# Patient Record
Sex: Female | Born: 1980 | Race: Black or African American | Hispanic: No | Marital: Single | Smoking: Never smoker
Health system: Southern US, Community
[De-identification: ages and names within clinical notes are randomized; demographics above are authoritative.]

## PROBLEM LIST (undated history)

## (undated) ENCOUNTER — Emergency Department (HOSPITAL_COMMUNITY)

## (undated) ENCOUNTER — Ambulatory Visit (HOSPITAL_COMMUNITY): Admission: EM | Payer: Self-pay | Source: Home / Self Care

## (undated) DIAGNOSIS — R87619 Unspecified abnormal cytological findings in specimens from cervix uteri: Secondary | ICD-10-CM

## (undated) DIAGNOSIS — IMO0002 Reserved for concepts with insufficient information to code with codable children: Secondary | ICD-10-CM

## (undated) DIAGNOSIS — O139 Gestational [pregnancy-induced] hypertension without significant proteinuria, unspecified trimester: Secondary | ICD-10-CM

## (undated) HISTORY — PX: TONSILLECTOMY: SUR1361

---

## 2005-04-23 ENCOUNTER — Emergency Department (HOSPITAL_COMMUNITY): Admission: EM | Admit: 2005-04-23 | Discharge: 2005-04-23 | Payer: Self-pay | Admitting: Emergency Medicine

## 2006-05-09 ENCOUNTER — Emergency Department (HOSPITAL_COMMUNITY): Admission: EM | Admit: 2006-05-09 | Discharge: 2006-05-09 | Payer: Self-pay | Admitting: *Deleted

## 2007-10-13 ENCOUNTER — Inpatient Hospital Stay (HOSPITAL_COMMUNITY): Admission: RE | Admit: 2007-10-13 | Discharge: 2007-10-13 | Payer: Self-pay | Admitting: Obstetrics & Gynecology

## 2007-10-15 ENCOUNTER — Inpatient Hospital Stay (HOSPITAL_COMMUNITY): Admission: AD | Admit: 2007-10-15 | Discharge: 2007-10-15 | Payer: Self-pay | Admitting: Obstetrics & Gynecology

## 2007-10-19 ENCOUNTER — Inpatient Hospital Stay (HOSPITAL_COMMUNITY): Admission: AD | Admit: 2007-10-19 | Discharge: 2007-10-20 | Payer: Self-pay | Admitting: Obstetrics & Gynecology

## 2008-01-27 ENCOUNTER — Inpatient Hospital Stay (HOSPITAL_COMMUNITY): Admission: AD | Admit: 2008-01-27 | Discharge: 2008-01-27 | Payer: Self-pay | Admitting: Family Medicine

## 2008-03-20 ENCOUNTER — Ambulatory Visit (HOSPITAL_COMMUNITY): Admission: RE | Admit: 2008-03-20 | Discharge: 2008-03-20 | Payer: Self-pay | Admitting: Obstetrics and Gynecology

## 2010-03-03 ENCOUNTER — Encounter: Payer: Self-pay | Admitting: Obstetrics & Gynecology

## 2010-04-14 IMAGING — US US OB DETAIL+14 WK
1 series · 14 of 28 positions shown · non-contrast
Comparison: none

OBSTETRICAL ULTRASOUND:
 This ultrasound was performed in The [HOSPITAL], and the [facility] report will be stored to [REDACTED] PACS.

[Series 1: us ob detail+14 wk · 14 of 88 slices shown]
[im 4/88]
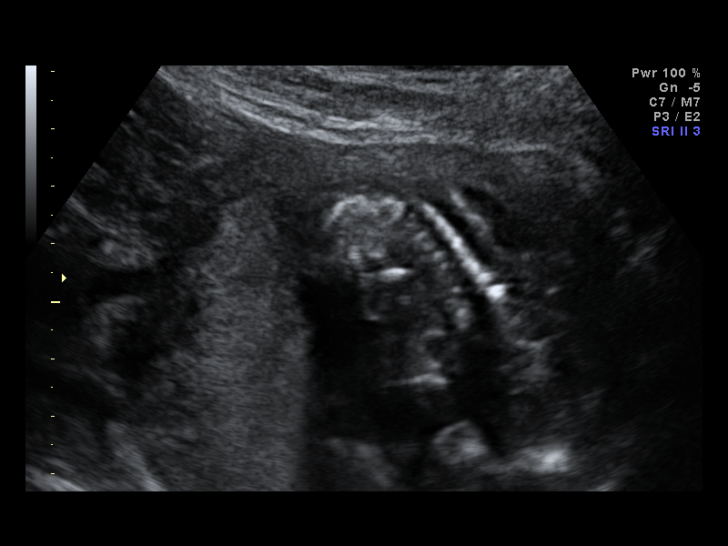
[im 10/88]
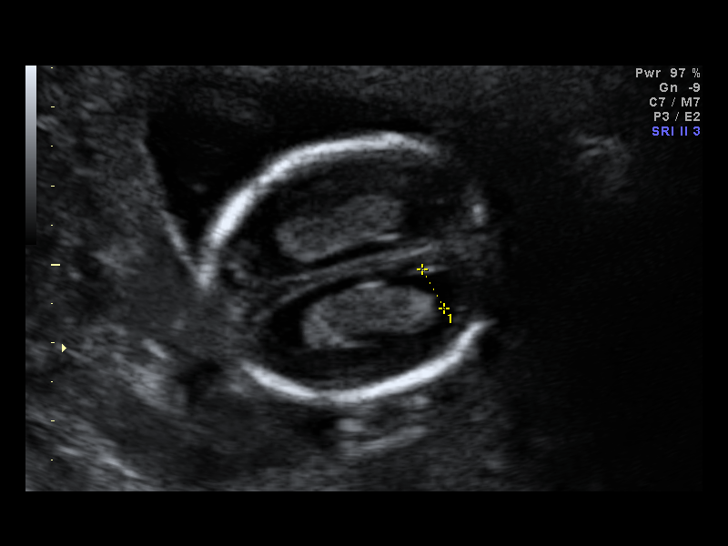
[im 17/88]
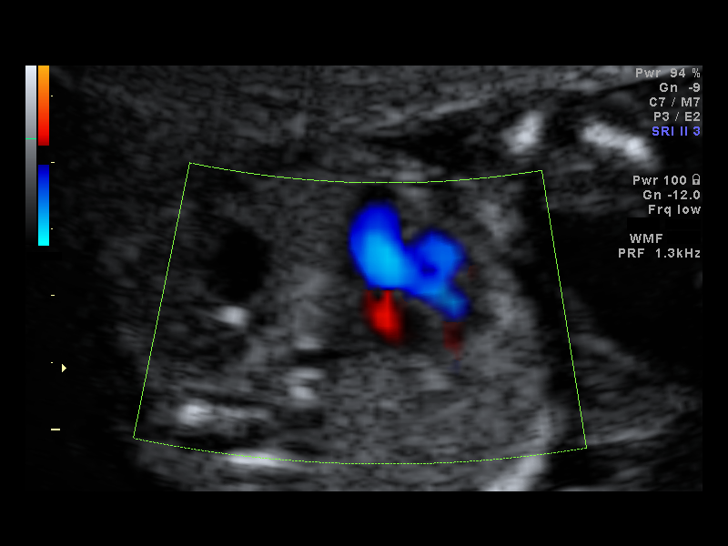
[im 23/88]
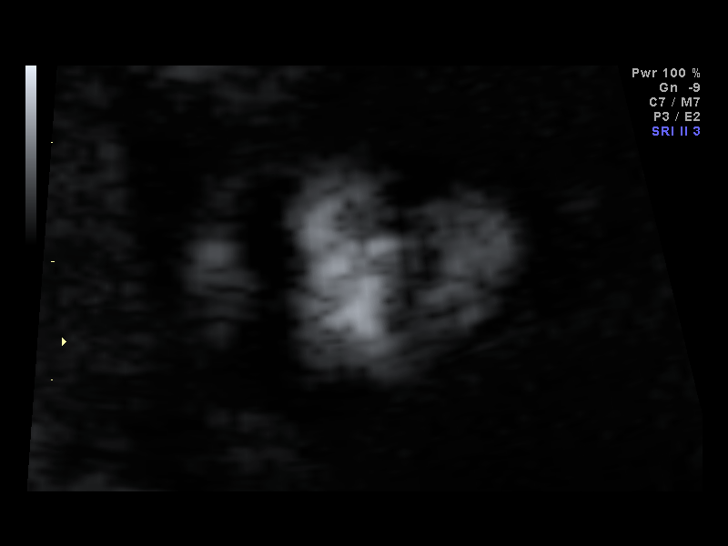
[im 30/88]
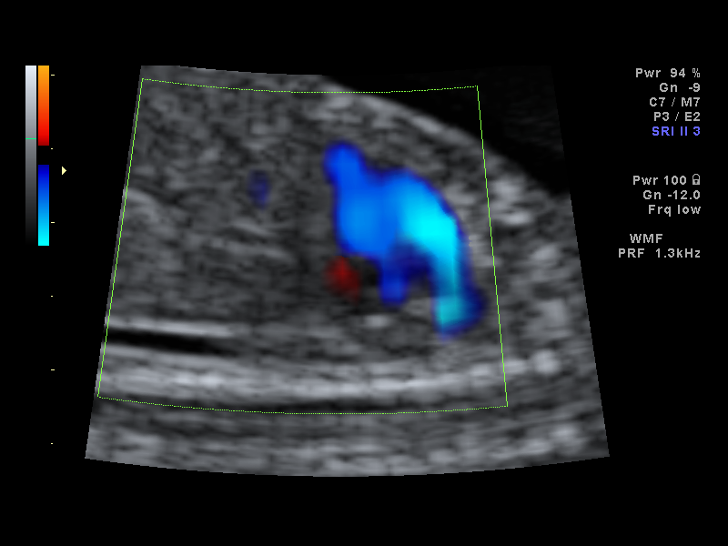
[im 36/88]
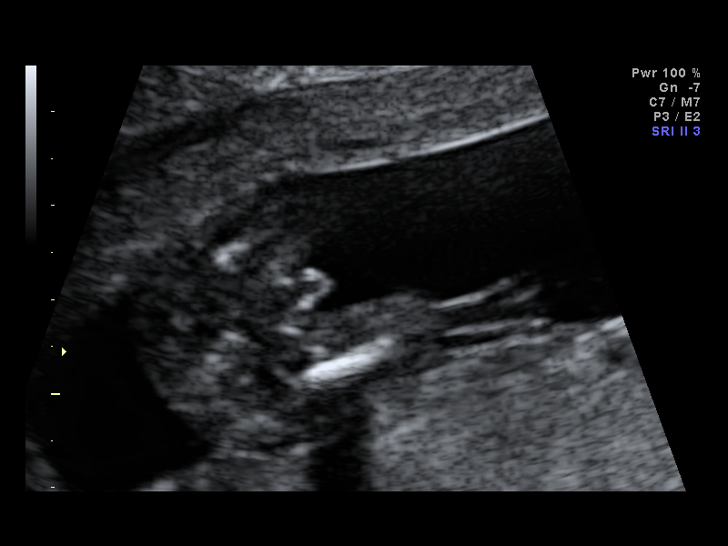
[im 42/88]
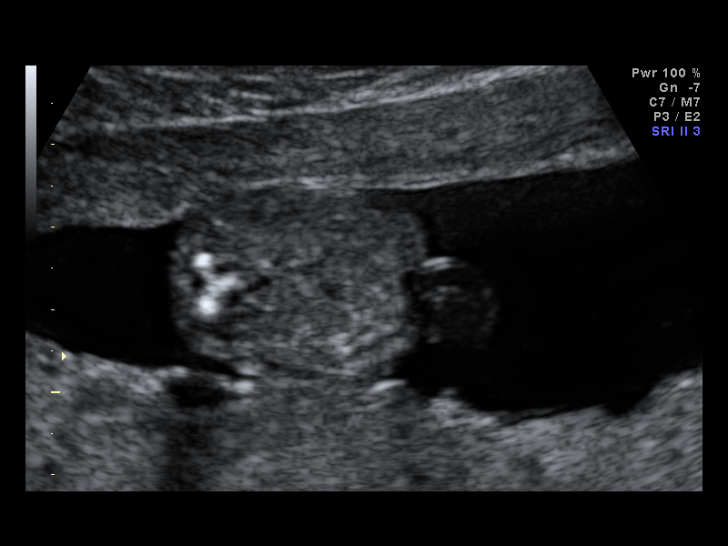
[im 49/88]
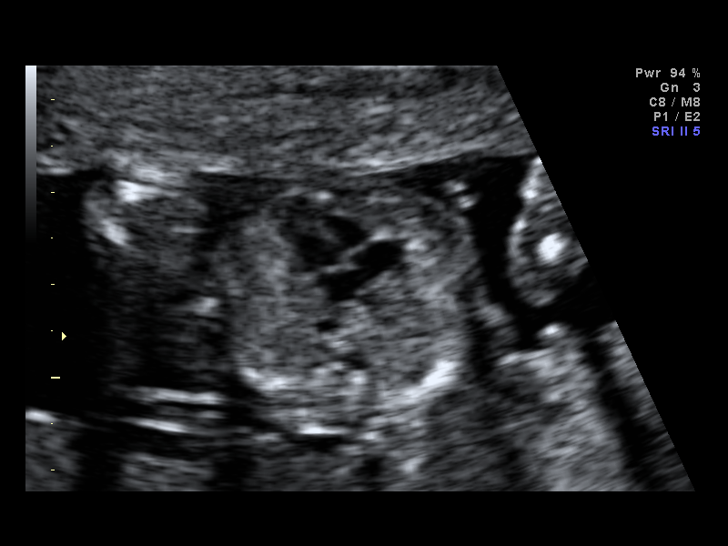
[im 55/88]
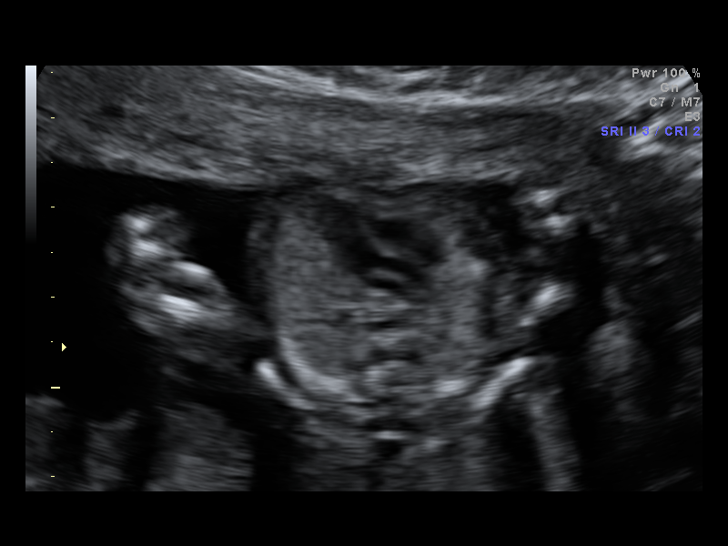
[im 62/88]
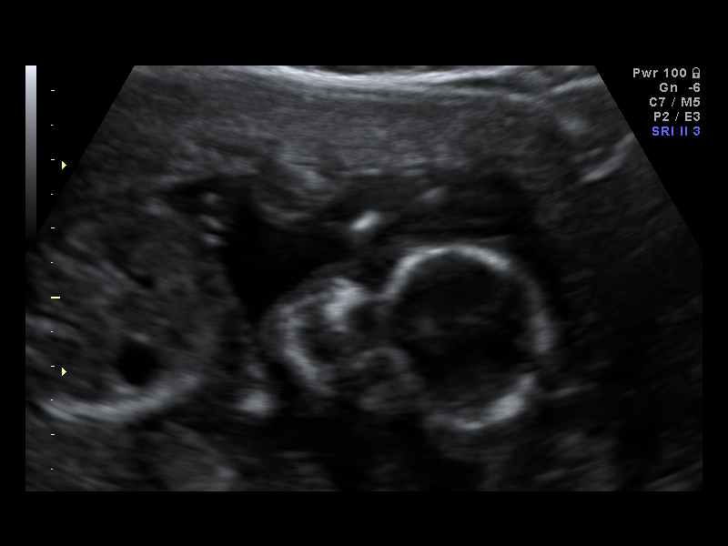
[im 68/88]
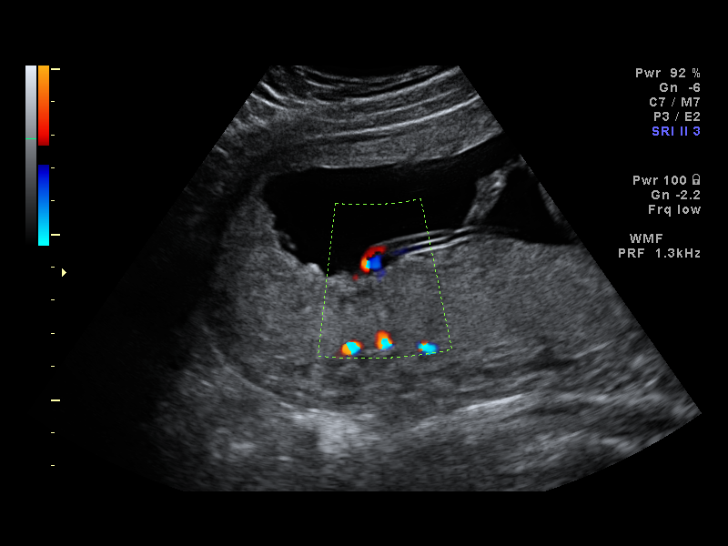
[im 75/88]
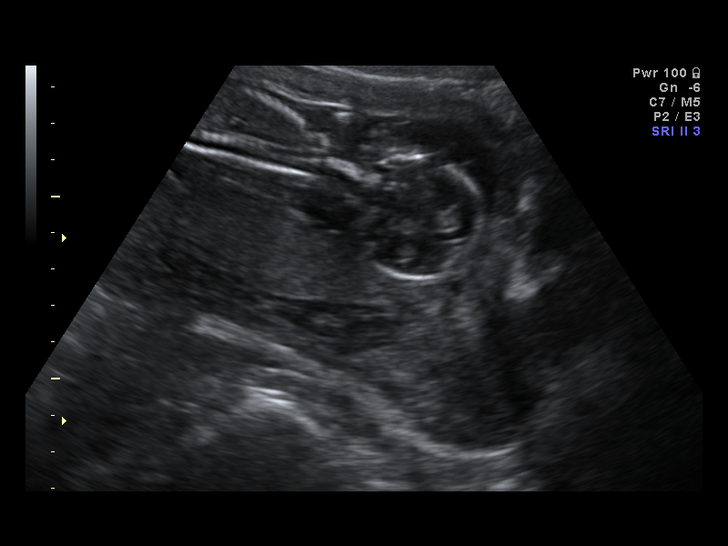
[im 81/88]
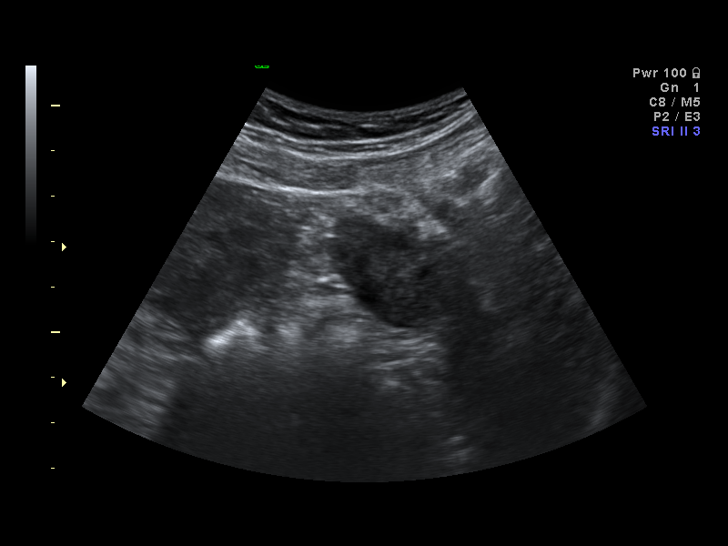
[im 88/88]
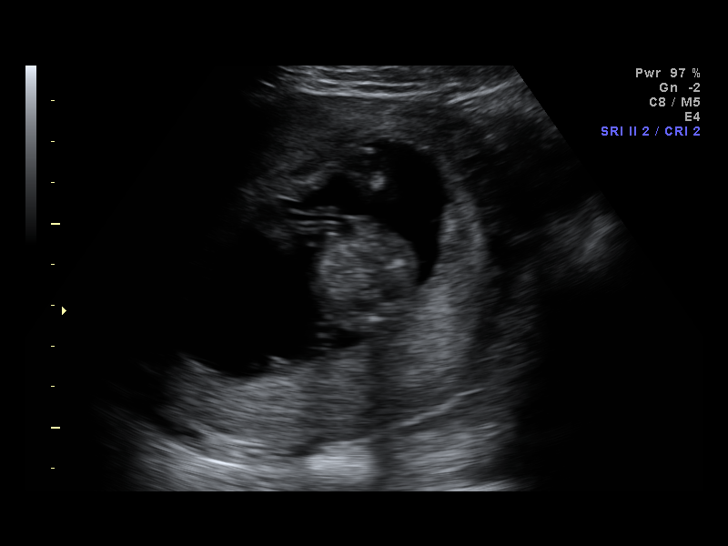

[14 of 28 positions shown; findings below may reference images not displayed]

IMPRESSION: [facility] OB/GYN has also been faxed to the ordering physician.

## 2010-05-28 ENCOUNTER — Inpatient Hospital Stay (INDEPENDENT_AMBULATORY_CARE_PROVIDER_SITE_OTHER)
Admission: RE | Admit: 2010-05-28 | Discharge: 2010-05-28 | Disposition: A | Discharge status: Home / Self Care | Payer: Self-pay | Source: Ambulatory Visit | Attending: Family Medicine | Admitting: Family Medicine

## 2010-05-28 ENCOUNTER — Encounter: Payer: Self-pay | Admitting: Family Medicine

## 2010-05-28 DIAGNOSIS — J029 Acute pharyngitis, unspecified: Secondary | ICD-10-CM

## 2010-05-28 LAB — CONVERTED CEMR LAB: Rapid Strep: NEGATIVE

## 2010-05-31 ENCOUNTER — Telehealth (INDEPENDENT_AMBULATORY_CARE_PROVIDER_SITE_OTHER): Payer: Self-pay

## 2010-07-04 LAB — RUBELLA ANTIBODY, IGM: Rubella: IMMUNE

## 2010-07-04 LAB — ANTIBODY SCREEN: Antibody Screen: NEGATIVE

## 2010-07-04 LAB — ABO/RH: RH Type: POSITIVE

## 2010-07-04 LAB — HIV ANTIBODY (ROUTINE TESTING W REFLEX): HIV: NONREACTIVE

## 2010-07-04 LAB — STREP B DNA PROBE: GBS: POSITIVE

## 2010-07-04 LAB — RPR: RPR: NONREACTIVE

## 2010-10-30 ENCOUNTER — Ambulatory Visit
Admission: RE | Admit: 2010-10-30 | Discharge: 2010-10-30 | Disposition: A | Discharge status: Home / Self Care | Payer: No Typology Code available for payment source | Source: Ambulatory Visit | Attending: Obstetrics and Gynecology | Admitting: Obstetrics and Gynecology

## 2010-10-30 ENCOUNTER — Other Ambulatory Visit: Payer: Self-pay | Admitting: Obstetrics and Gynecology

## 2010-10-30 DIAGNOSIS — R05 Cough: Secondary | ICD-10-CM

## 2010-11-12 LAB — URINALYSIS, ROUTINE W REFLEX MICROSCOPIC
Hgb urine dipstick: NEGATIVE
Nitrite: NEGATIVE
Specific Gravity, Urine: 1.02
Urobilinogen, UA: 0.2

## 2010-11-12 LAB — ABO/RH: ABO/RH(D): B POS

## 2010-11-12 LAB — GC/CHLAMYDIA PROBE AMP, GENITAL
Chlamydia, DNA Probe: NEGATIVE
GC Probe Amp, Genital: NEGATIVE

## 2010-11-12 LAB — CBC
HCT: 34.7 — ABNORMAL LOW
Platelets: 278
RDW: 13.4
WBC: 6.9

## 2010-11-12 LAB — WET PREP, GENITAL: Yeast Wet Prep HPF POC: NONE SEEN

## 2010-11-12 LAB — HCG, QUANTITATIVE, PREGNANCY: hCG, Beta Chain, Quant, S: 1839 — ABNORMAL HIGH

## 2010-11-14 LAB — CBC
HCT: 34.3 % — ABNORMAL LOW (ref 36.0–46.0)
MCV: 85.7 fL (ref 78.0–100.0)
Platelets: 216 10*3/uL (ref 150–400)
RBC: 4.01 MIL/uL (ref 3.87–5.11)
WBC: 9.9 10*3/uL (ref 4.0–10.5)

## 2010-11-14 LAB — URINALYSIS, ROUTINE W REFLEX MICROSCOPIC
Glucose, UA: NEGATIVE mg/dL
Hgb urine dipstick: NEGATIVE
Specific Gravity, Urine: 1.02 (ref 1.005–1.030)
pH: 6.5 (ref 5.0–8.0)

## 2010-11-14 LAB — WET PREP, GENITAL
Trich, Wet Prep: NONE SEEN
Yeast Wet Prep HPF POC: NONE SEEN

## 2010-11-14 LAB — ABO/RH: ABO/RH(D): B POS

## 2010-11-14 LAB — GC/CHLAMYDIA PROBE AMP, GENITAL: GC Probe Amp, Genital: NEGATIVE

## 2010-12-08 ENCOUNTER — Encounter (HOSPITAL_COMMUNITY): Payer: Self-pay | Admitting: Obstetrics

## 2010-12-08 ENCOUNTER — Observation Stay (HOSPITAL_COMMUNITY)
Admission: AD | Admit: 2010-12-08 | Discharge: 2010-12-08 | Disposition: A | Discharge status: Home / Self Care | Payer: No Typology Code available for payment source | Source: Ambulatory Visit | Attending: Obstetrics and Gynecology | Admitting: Obstetrics and Gynecology

## 2010-12-08 DIAGNOSIS — O99891 Other specified diseases and conditions complicating pregnancy: Secondary | ICD-10-CM | POA: Insufficient documentation

## 2010-12-08 DIAGNOSIS — Z98891 History of uterine scar from previous surgery: Secondary | ICD-10-CM

## 2010-12-08 DIAGNOSIS — R51 Headache: Secondary | ICD-10-CM

## 2010-12-08 DIAGNOSIS — O139 Gestational [pregnancy-induced] hypertension without significant proteinuria, unspecified trimester: Principal | ICD-10-CM

## 2010-12-08 DIAGNOSIS — O34219 Maternal care for unspecified type scar from previous cesarean delivery: Secondary | ICD-10-CM | POA: Insufficient documentation

## 2010-12-08 DIAGNOSIS — Z22330 Carrier of Group B streptococcus: Secondary | ICD-10-CM | POA: Insufficient documentation

## 2010-12-08 DIAGNOSIS — R519 Headache, unspecified: Secondary | ICD-10-CM

## 2010-12-08 HISTORY — DX: Unspecified abnormal cytological findings in specimens from cervix uteri: R87.619

## 2010-12-08 HISTORY — DX: Reserved for concepts with insufficient information to code with codable children: IMO0002

## 2010-12-08 HISTORY — DX: Gestational (pregnancy-induced) hypertension without significant proteinuria, unspecified trimester: O13.9

## 2010-12-08 LAB — CBC
Hemoglobin: 11.2 g/dL — ABNORMAL LOW (ref 12.0–15.0)
RBC: 3.9 MIL/uL (ref 3.87–5.11)
WBC: 11 10*3/uL — ABNORMAL HIGH (ref 4.0–10.5)

## 2010-12-08 LAB — URINALYSIS, ROUTINE W REFLEX MICROSCOPIC
Leukocytes, UA: NEGATIVE
Nitrite: NEGATIVE
Specific Gravity, Urine: 1.02 (ref 1.005–1.030)
pH: 7 (ref 5.0–8.0)

## 2010-12-08 LAB — COMPREHENSIVE METABOLIC PANEL
ALT: 9 U/L (ref 0–35)
AST: 13 U/L (ref 0–37)
Alkaline Phosphatase: 108 U/L (ref 39–117)
CO2: 24 mEq/L (ref 19–32)
GFR calc Af Amer: >90 mL/min (ref >90)
GFR calc non Af Amer: >90 mL/min (ref >90)
Glucose, Bld: 105 mg/dL — ABNORMAL HIGH (ref 70–99)
Potassium: 3.9 mEq/L (ref 3.5–5.1)
Sodium: 135 mEq/L (ref 135–145)

## 2010-12-08 MED ORDER — ACETAMINOPHEN-CODEINE #3 300-30 MG PO TABS: 1.0000 | ORAL_TABLET | ORAL | Status: DC | PRN

## 2010-12-08 MED ORDER — ACETAMINOPHEN-CODEINE #3 300-30 MG PO TABS
1.0000 | ORAL_TABLET | ORAL | Status: DC | PRN
  Administered 2010-12-08: 1 via ORAL
  Filled 2010-12-08: qty 2

## 2010-12-08 NOTE — Progress Notes (Signed)
S:  Candice Huffman is a 30y.o. Engaged black female G3P1011 at 39 weeks per [facility] 12/15/10 who presents w/ CC of frontal HA since around 2am, and vision being unfocused this afternoon making her feel "faint."  Pt has taken 2gm of Tylenol today, and HA unrelieved; rates HA 2/10.  She denies any other PIH s/s.  Denies UTI s/s, resp or GI c/o's; no LOF or VB.  Reports occasional ctx, and good fetal movement.  Cx 1cm at last week's appt.  Followed by CNM service at [facility] and desires TOLAC.  Pregnancy r/f 1.  H/o prev c/s--induced for PIH and c/s for fetal tachycardia and intrapartal fever  2.  H/o PIH--no meds before or after delivery  3.  1st trimester VB--SCH on u/s  4.  H/o abnl pap/colpo 5.  GBS pos in urine  6.  Pt is an [employer] fund[employer]  7.  H/o anxiety  O:  Serial BP's:  146/98, 132/82, 132/81, 128/84, 147/85, 139/85, 139/78; .Marland Kitchen Filed Vitals:   12/08/10 1900 12/08/10 1917 12/08/10 1932 12/08/10 1947  BP: 128/84 147/85 139/85 139/78  Pulse: 86 81 89 87  Temp:      TempSrc:      Resp:  18 18   Height: 5\' 4"  (1.626 m)     Weight: 89.812 kg (198 lb)       Results for orders placed during the hospital encounter of 12/08/10 (from the past 24 hour(s))  URINALYSIS, ROUTINE W REFLEX MICROSCOPIC     Status: Normal   Collection Time   12/08/10  6:20 PM      Component Value Range   Color, Urine YELLOW  YELLOW    Appearance CLEAR  CLEAR    Specific Gravity, Urine 1.020  1.005 - 1.030    pH 7.0  5.0 - 8.0    Glucose, UA NEGATIVE  NEGATIVE (mg/dL)   Hgb urine dipstick NEGATIVE  NEGATIVE    Bilirubin Urine NEGATIVE  NEGATIVE    Ketones, ur NEGATIVE  NEGATIVE (mg/dL)   Protein, ur NEGATIVE  NEGATIVE (mg/dL)   Urobilinogen, UA 1.0  0.0 - 1.0 (mg/dL)   Nitrite NEGATIVE  NEGATIVE    Leukocytes, UA NEGATIVE  NEGATIVE   CBC     Status: Abnormal   Collection Time   12/08/10  7:01 PM      Component Value Range   WBC 11.0 (*) 4.0 - 10.5 (K/uL)   RBC 3.90  3.87 - 5.11 (MIL/uL)   Hemoglobin 11.2 (*) 12.0 - 15.0  (g/dL)   HCT 16.1 (*) 09.6 - 46.0 (%)   MCV 87.2  78.0 - 100.0 (fL)   MCH 28.7  26.0 - 34.0 (pg)   MCHC 32.9  30.0 - 36.0 (g/dL)   RDW 04.5  40.9 - 81.1 (%)   Platelets 197  150 - 400 (K/uL)  COMPREHENSIVE METABOLIC PANEL     Status: Abnormal   Collection Time   12/08/10  7:01 PM      Component Value Range   Sodium 135  135 - 145 (mEq/L)   Potassium 3.9  3.5 - 5.1 (mEq/L)   Chloride 104  96 - 112 (mEq/L)   CO2 24  19 - 32 (mEq/L)   Glucose, Bld 105 (*) 70 - 99 (mg/dL)   BUN 7  6 - 23 (mg/dL)   Creatinine, Ser 9.14  0.50 - 1.10 (mg/dL)   Calcium 9.7  8.4 - 78.2 (mg/dL)   Total Protein 6.7  6.0 - 8.3 (g/dL)  Albumin 2.8 (*) 3.5 - 5.2 (g/dL)   AST 13  0 - 37 (U/L)   ALT 9  0 - 35 (U/L)   Alkaline Phosphatase 108  39 - 117 (U/L)   Total Bilirubin 0.3  0.3 - 1.2 (mg/dL)   GFR calc non Af Amer >90  >90 (mL/min)   GFR calc Af Amer >90  >90 (mL/min)  LACTATE DEHYDROGENASE     Status: Normal   Collection Time   12/08/10  7:01 PM      Component Value Range   LD 200  94 - 250 (U/L)  URIC ACID     Status: Normal   Collection Time   12/08/10  7:01 PM      Component Value Range   Uric Acid, Serum 4.1  2.4 - 7.0 (mg/dL)  EFM:  409, reactive, moderate variability, no decels; TOCO:  occ'l ctxs--mild to mod  PE:  Gen:  NAD, A&Ox3, pleasant, glasses;         Lungs:  CTA B         CV:  RRR         ABD:  Gravid, soft, NT         CX:  Loose 1cm/60/-1, posterior, soft         EXT:  Tr edema--nonpitting; no clonus, DTR1+ BLE A:  1.  IUP at 39 weeks       2.  Mild gestational hypertension      3.  Nml PIH labs      4.  Cat I FHT/Reactive      5.  Unfavorable cx      6.  GBS pos      7.  Prev c/s & desires TOLAC      8.  H/o PIH last pregnancy P:  1.  Per c/w Dr. Rivard--d/c home w/ [facility] & labor precautions, [facility]       2.  Level 2 BR      3.  Called in Tylenol #3 Rx to [facility] on [address]     4.  F/u at office tomorrow morning as scheduled at 0845 or prn     5.  Disc'd importance of  increased rest for BP; eluded may be asked to collect a 24 hr urine at some point; begin antenatal fetal testing.

## 2010-12-08 NOTE — Discharge Summary (Signed)
Pt given discharge instruction. Pt verbalized understanding.

## 2010-12-10 NOTE — Progress Notes (Signed)
UR chart review completed.  

## 2010-12-14 ENCOUNTER — Inpatient Hospital Stay (HOSPITAL_COMMUNITY): Payer: No Typology Code available for payment source | Admitting: Anesthesiology

## 2010-12-14 ENCOUNTER — Encounter (HOSPITAL_COMMUNITY): Payer: Self-pay | Admitting: Obstetrics and Gynecology

## 2010-12-14 ENCOUNTER — Inpatient Hospital Stay (HOSPITAL_COMMUNITY)
Admission: AD | Admit: 2010-12-14 | Discharge: 2010-12-17 | DRG: 767 | Disposition: A | Discharge status: Home / Self Care | Payer: No Typology Code available for payment source | Source: Ambulatory Visit | Attending: Obstetrics and Gynecology | Admitting: Obstetrics and Gynecology

## 2010-12-14 ENCOUNTER — Encounter (HOSPITAL_COMMUNITY): Payer: Self-pay | Admitting: Anesthesiology

## 2010-12-14 DIAGNOSIS — O139 Gestational [pregnancy-induced] hypertension without significant proteinuria, unspecified trimester: Principal | ICD-10-CM | POA: Diagnosis present

## 2010-12-14 DIAGNOSIS — IMO0001 Reserved for inherently not codable concepts without codable children: Secondary | ICD-10-CM

## 2010-12-14 DIAGNOSIS — Z302 Encounter for sterilization: Secondary | ICD-10-CM | POA: Diagnosis not present

## 2010-12-14 DIAGNOSIS — Z8659 Personal history of other mental and behavioral disorders: Secondary | ICD-10-CM

## 2010-12-14 DIAGNOSIS — O99892 Other specified diseases and conditions complicating childbirth: Secondary | ICD-10-CM | POA: Diagnosis present

## 2010-12-14 DIAGNOSIS — Z22330 Carrier of Group B streptococcus: Secondary | ICD-10-CM

## 2010-12-14 DIAGNOSIS — O34219 Maternal care for unspecified type scar from previous cesarean delivery: Secondary | ICD-10-CM | POA: Diagnosis present

## 2010-12-14 DIAGNOSIS — O99820 Streptococcus B carrier state complicating pregnancy: Secondary | ICD-10-CM

## 2010-12-14 LAB — CBC
HCT: 33.5 % — ABNORMAL LOW (ref 36.0–46.0)
Hemoglobin: 11.1 g/dL — ABNORMAL LOW (ref 12.0–15.0)
MCH: 28.6 pg (ref 26.0–34.0)
WBC: 13.5 10*3/uL — ABNORMAL HIGH (ref 4.0–10.5)

## 2010-12-14 LAB — COMPREHENSIVE METABOLIC PANEL
ALT: 10 U/L (ref 0–35)
Alkaline Phosphatase: 123 U/L — ABNORMAL HIGH (ref 39–117)
BUN: 8 mg/dL (ref 6–23)
CO2: 22 mEq/L (ref 19–32)
GFR calc Af Amer: >90 mL/min (ref >90)
GFR calc non Af Amer: >90 mL/min (ref >90)
Glucose, Bld: 88 mg/dL (ref 70–99)
Potassium: 3.7 mEq/L (ref 3.5–5.1)
Sodium: 132 mEq/L — ABNORMAL LOW (ref 135–145)

## 2010-12-14 LAB — URINALYSIS, ROUTINE W REFLEX MICROSCOPIC
Bilirubin Urine: NEGATIVE
Ketones, ur: NEGATIVE mg/dL
Nitrite: NEGATIVE
Protein, ur: NEGATIVE mg/dL
Urobilinogen, UA: 0.2 mg/dL (ref 0.0–1.0)

## 2010-12-14 LAB — RPR: RPR Ser Ql: NONREACTIVE

## 2010-12-14 MED ORDER — PHENYLEPHRINE 40 MCG/ML (10ML) SYRINGE FOR IV PUSH (FOR BLOOD PRESSURE SUPPORT)
80.0000 ug | PREFILLED_SYRINGE | INTRAVENOUS | Status: DC | PRN
  Filled 2010-12-14: qty 5

## 2010-12-14 MED ORDER — FLEET ENEMA 7-19 GM/118ML RE ENEM: 1.0000 | ENEMA | RECTAL | Status: DC | PRN

## 2010-12-14 MED ORDER — EPHEDRINE 5 MG/ML INJ: 10.0000 mg | INTRAVENOUS | Status: DC | PRN

## 2010-12-14 MED ORDER — LIDOCAINE HCL (PF) 1 % IJ SOLN
30.0000 mL | INTRAMUSCULAR | Status: DC | PRN
  Filled 2010-12-14: qty 30

## 2010-12-14 MED ORDER — OXYTOCIN 20 UNITS IN LACTATED RINGERS INFUSION - SIMPLE
125.0000 mL/h | Freq: Once | INTRAVENOUS | Status: AC
  Administered 2010-12-15: 999 mL/h via INTRAVENOUS

## 2010-12-14 MED ORDER — OXYCODONE-ACETAMINOPHEN 5-325 MG PO TABS: 2.0000 | ORAL_TABLET | ORAL | Status: DC | PRN

## 2010-12-14 MED ORDER — FENTANYL 2.5 MCG/ML BUPIVACAINE 1/10 % EPIDURAL INFUSION (WH - ANES)
INTRAMUSCULAR | Status: DC | PRN
  Administered 2010-12-14: 14 mL/h via EPIDURAL

## 2010-12-14 MED ORDER — BUTORPHANOL TARTRATE 2 MG/ML IJ SOLN
1.0000 mg | INTRAMUSCULAR | Status: DC | PRN
  Administered 2010-12-14: 1 mg via INTRAVENOUS
  Filled 2010-12-14 (×2): qty 1

## 2010-12-14 MED ORDER — LACTATED RINGERS IV SOLN: 500.0000 mL | Freq: Once | INTRAVENOUS | Status: DC

## 2010-12-14 MED ORDER — ACETAMINOPHEN 325 MG PO TABS: 650.0000 mg | ORAL_TABLET | ORAL | Status: DC | PRN

## 2010-12-14 MED ORDER — OXYTOCIN BOLUS FROM INFUSION
500.0000 mL | Freq: Once | INTRAVENOUS | Status: AC
  Administered 2010-12-14: 500 mL via INTRAVENOUS
  Filled 2010-12-14: qty 500

## 2010-12-14 MED ORDER — IBUPROFEN 600 MG PO TABS
600.0000 mg | ORAL_TABLET | Freq: Four times a day (QID) | ORAL | Status: DC | PRN
  Administered 2010-12-16: 600 mg via ORAL
  Filled 2010-12-14 (×5): qty 1

## 2010-12-14 MED ORDER — CITRIC ACID-SODIUM CITRATE 334-500 MG/5ML PO SOLN: 30.0000 mL | ORAL | Status: DC | PRN

## 2010-12-14 MED ORDER — FENTANYL 2.5 MCG/ML BUPIVACAINE 1/10 % EPIDURAL INFUSION (WH - ANES)
14.0000 mL/h | INTRAMUSCULAR | Status: DC
  Administered 2010-12-14 – 2010-12-15 (×2): 14 mL/h via EPIDURAL
  Filled 2010-12-14 (×3): qty 60

## 2010-12-14 MED ORDER — LACTATED RINGERS IV SOLN
INTRAVENOUS | Status: DC
  Administered 2010-12-14 (×2): via INTRAVENOUS

## 2010-12-14 MED ORDER — DIPHENHYDRAMINE HCL 50 MG/ML IJ SOLN: 12.5000 mg | INTRAMUSCULAR | Status: DC | PRN

## 2010-12-14 MED ORDER — HYDROXYZINE HCL 50 MG/ML IM SOLN: 50.0000 mg | Freq: Four times a day (QID) | INTRAMUSCULAR | Status: DC | PRN

## 2010-12-14 MED ORDER — PENICILLIN G POTASSIUM 5000000 UNITS IJ SOLR
2.5000 10*6.[IU] | INTRAVENOUS | Status: DC
  Administered 2010-12-14 – 2010-12-15 (×2): 2.5 10*6.[IU] via INTRAVENOUS
  Filled 2010-12-14 (×6): qty 2.5

## 2010-12-14 MED ORDER — ONDANSETRON HCL 4 MG/2ML IJ SOLN: 4.0000 mg | Freq: Four times a day (QID) | INTRAMUSCULAR | Status: DC | PRN

## 2010-12-14 MED ORDER — HYDROXYZINE HCL 50 MG PO TABS: 50.0000 mg | ORAL_TABLET | Freq: Four times a day (QID) | ORAL | Status: DC | PRN

## 2010-12-14 MED ORDER — PHENYLEPHRINE 40 MCG/ML (10ML) SYRINGE FOR IV PUSH (FOR BLOOD PRESSURE SUPPORT): 80.0000 ug | PREFILLED_SYRINGE | INTRAVENOUS | Status: DC | PRN

## 2010-12-14 MED ORDER — EPHEDRINE 5 MG/ML INJ
10.0000 mg | INTRAVENOUS | Status: DC | PRN
  Filled 2010-12-14: qty 4

## 2010-12-14 MED ORDER — SODIUM BICARBONATE 8.4 % IV SOLN
INTRAVENOUS | Status: DC | PRN
  Administered 2010-12-14: 4 mL via EPIDURAL

## 2010-12-14 MED ORDER — PENICILLIN G POTASSIUM 5000000 UNITS IJ SOLR
5.0000 10*6.[IU] | Freq: Once | INTRAVENOUS | Status: AC
  Administered 2010-12-14: 5 10*6.[IU] via INTRAVENOUS
  Filled 2010-12-14: qty 5

## 2010-12-14 MED ORDER — LACTATED RINGERS IV SOLN
500.0000 mL | INTRAVENOUS | Status: DC | PRN
  Administered 2010-12-14 (×2): 1000 mL via INTRAVENOUS

## 2010-12-14 NOTE — Anesthesia Procedure Notes (Signed)
Epidural Patient location during procedure: OB  Preanesthetic Checklist Completed: patient identified, site marked, surgical consent, pre-op evaluation, timeout performed, IV checked, risks and benefits discussed and monitors and equipment checked  Epidural Patient position: sitting Prep: site prepped and draped and DuraPrep Patient monitoring: continuous pulse ox and blood pressure Approach: midline Injection technique: LOR air  Needle:  Needle type: Tuohy  Needle gauge: 17 G Needle length: 9 cm Needle insertion depth: 6 cm Catheter type: closed end flexible Catheter size: 19 Gauge Catheter at skin depth: 12 cm Test dose: negative  Assessment Events: blood not aspirated, injection not painful, no injection resistance, negative IV test and no paresthesia  Additional Notes 1st LOR, catheter would not feed.  Removed and replaced needle at same level, with same LOR.  Catheter placed easily. Dosing of Epidural:  1st dose, through needle ............................................Marland Kitchen epi 1:200K + Xylocaine 40 mg  2nd dose, through catheter, after waiting 3 minutes...Marland KitchenMarland Kitchenepi 1:200K + Xylocaine 40 mg  3rd dose, through catheter after waiting 3 minutes .............................Marcaine   4mg    ( mg Marcaine are expressed as equivilent  cc's medication removed from the 0.1%Bupiv / fentanyl syringe from L&D pump)  ( 2% Xylo charted as a single dose in Epic Meds for ease of charting; actual dosing was fractionated as above, for saftey's sake)  As each dose occurred, patient was free of IV sx; and patient exhibited no evidence of SA injection.  Patient is more comfortable after epidural dosed. Please see RN's note for documentation of vital signs,and FHR which are stable.

## 2010-12-14 NOTE — Progress Notes (Signed)
Contractions regular 3 to 5 minutes apart since 0700 today, G2P! 39.6

## 2010-12-14 NOTE — H&P (Signed)
Candice Huffman is a 30 y.o.engaged black female presenting at 39.6 weeks unannounced for labor check.  Irregular ctxs since last evening, but able to sleep until 0530, and since have overtime become more intense and closer together.  Denies PIH or UTI s/s; no VB or LOF.  Cx last week loose 1cm.  Followed by CNM service at [facility].  Desires TOLAC.  Pt seen 6 days ago for Headache and PIH workup; has been on Level 2 bedrest since.  Taken out of work 1st of October.  Onset of care at [facility] at 16 weeks.   Maternal Medical History:  Reason for admission: Reason for admission: contractions and nausea.  Contractions: Onset was yesterday.   Frequency: regular.   Perceived severity is moderate.    Fetal activity: Perceived fetal activity is normal.   Last perceived fetal movement was within the past hour.    Prenatal complications: 1.  Previous c/s for chorio, IP fever, fetal tachycardia--induced for PIH 2.  H/o PIH last pregnancy--no meds antenatally or postpartum 3.  GBS positive 4.  H/o anxiety 5.  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  1st trimester VB--SCH on u/s    OB History    Grav Para Term Preterm Abortions TAB SAB Ect Mult Living   3 1 1  0 1 0 1 0 0 1     Past Medical History  Diagnosis Date  . Abnormal Pap smear   . Pregnancy induced hypertension    Past Surgical History  Procedure Date  . Cesarean section   . Tonsillectomy    Family History: family history is not on file. Social History:  does not have a smoking history on file. She does not have any smokeless tobacco history on file. She reports that she does not drink alcohol or use illicit drugs.  Review of Systems  Constitutional: Negative.   Eyes: Negative.   Respiratory: Negative.   Cardiovascular: Negative.   Gastrointestinal: Positive for nausea.  Skin: Negative.   Neurological: Negative.     Dilation: 4.5 Effacement (%): 80 Station: -1 Exam by:: H. Rayn Shorb CNM  Blood pressure  138/88, pulse 106, temperature 97.8 F (36.6 C), temperature source Oral, resp. rate 20, height 5\' 4"  (1.626 m), weight 92.625 kg (204 lb 3.2 oz). BP range since arrival to [facility]:  129-143/70-88  Maternal Exam:  Uterine Assessment: Contraction strength is moderate.  Contraction frequency is regular.  UC's q3-4 min  Abdomen: Patient reports no abdominal tenderness. Surgical scars: low transverse.   Estimated fetal weight is 7-8 lbs.   Fetal presentation: vertex  Introitus: Normal vulva. Pelvis: adequate for delivery.   Cervix: Cervix evaluated by digital exam.     Fetal Exam Fetal Monitor Review: Mode: ultrasound.   Baseline rate: 130.  Variability: moderate (6-25 bpm).   Pattern: accelerations present and no decelerations.    Fetal State Assessment: Category I - tracings are normal.    LABS:    Results for orders placed during the hospital encounter of 12/14/10 (from the past 24 hour(s))  CBC     Status: Abnormal   Collection Time   12/14/10 12:01 PM      Component Value Range   WBC 13.5 (*) 4.0 - 10.5 (K/uL)   RBC 3.88  3.87 - 5.11 (MIL/uL)   Hemoglobin 11.1 (*) 12.0 - 15.0 (g/dL)   HCT 16.1 (*) 09.6 - 46.0 (%)   MCV 86.3  78.0 - 100.0 (fL)  MCH 28.6  26.0 - 34.0 (pg)   MCHC 33.1  30.0 - 36.0 (g/dL)   RDW 40.9  81.1 - 91.4 (%)   Platelets 202  150 - 400 (K/uL)  COMPREHENSIVE METABOLIC PANEL     Status: Abnormal   Collection Time   12/14/10 12:01 PM      Component Value Range   Sodium 132 (*) 135 - 145 (mEq/L)   Potassium 3.7  3.5 - 5.1 (mEq/L)   Chloride 103  96 - 112 (mEq/L)   CO2 22  19 - 32 (mEq/L)   Glucose, Bld 88  70 - 99 (mg/dL)   BUN 8  6 - 23 (mg/dL)   Creatinine, Ser 7.82  0.50 - 1.10 (mg/dL)   Calcium 9.5  8.4 - 95.6 (mg/dL)   Total Protein 6.4  6.0 - 8.3 (g/dL)   Albumin 2.9 (*) 3.5 - 5.2 (g/dL)   AST 15  0 - 37 (U/L)   ALT 10  0 - 35 (U/L)   Alkaline Phosphatase 123 (*) 39 - 117 (U/L)   Total Bilirubin 0.4  0.3 - 1.2 (mg/dL)   GFR calc non Af  Amer >90  >90 (mL/min)   GFR calc Af Amer >90  >90 (mL/min)  URIC ACID     Status: Normal   Collection Time   12/14/10 12:01 PM      Component Value Range   Uric Acid, Serum 4.5  2.4 - 7.0 (mg/dL)  LACTATE DEHYDROGENASE     Status: Normal   Collection Time   12/14/10 12:01 PM      Component Value Range   LD 197  94 - 250 (U/L)  URINALYSIS, ROUTINE W REFLEX MICROSCOPIC     Status: Normal   Collection Time   12/14/10  1:15 PM      Component Value Range   Color, Urine YELLOW  YELLOW    Appearance CLEAR  CLEAR    Specific Gravity, Urine 1.025  1.005 - 1.030    pH 6.5  5.0 - 8.0    Glucose, UA NEGATIVE  NEGATIVE (mg/dL)   Hgb urine dipstick NEGATIVE  NEGATIVE    Bilirubin Urine NEGATIVE  NEGATIVE    Ketones, ur NEGATIVE  NEGATIVE (mg/dL)   Protein, ur NEGATIVE  NEGATIVE (mg/dL)   Urobilinogen, UA 0.2  0.0 - 1.0 (mg/dL)   Nitrite NEGATIVE  NEGATIVE    Leukocytes, UA NEGATIVE  NEGATIVE    Physical Exam  Constitutional: She is oriented to person, place, and time. She appears well-developed and well-nourished.  Cardiovascular: Normal rate and regular rhythm.   Respiratory: Effort normal and breath sounds normal.  GI: Soft. Bowel sounds are normal.  Genitourinary:       cx on arrival to MAU:  3/70/-1 posterior After ambulating x1 hr:  4+/80/-1 anterior w/ membranes starting to bulge  Musculoskeletal: She exhibits no edema.  Neurological: She is alert and oriented to person, place, and time. She has normal reflexes.       No clonus  Skin: Skin is warm and dry.  Psychiatric: She has a normal mood and affect. Her behavior is normal. Judgment and thought content normal.    Prenatal labs: ABO, Rh: B/Positive/-- (05/25 0000) Antibody: Negative (05/25 0000) Rubella: Immune (05/25 0000) RPR: Nonreactive (05/25 0000)  HBsAg: Negative (05/25 0000)  HIV: Non-reactive (05/25 0000)  GBS: Positive (05/25 0000)  Pap negative at 16 weeks Quad screen negative 1hr gtt at 28  weeks=117  Assessment/Plan: 1.  IUP at  39.6 weeks 2.  Early active labor 3.  GBS positive 4. Mild GHTN--no meds w/ nml PIH labs and nml 24 hr urine last week 5.  Previous c/s--desires TOLAC  1.  Admit to [facility] w/ Dr. Stefano Gaul as attending 2.  Routine CNM orders 3.  PCN-G per GBS protocol 4.  AROM/Pitocin prn augmentation 5.  Epidural prn 6.  Will CTO BP closely 7.  C/w MD prn  Kaoru Rezendes H 12/14/2010, 5:07 PM

## 2010-12-14 NOTE — Anesthesia Preprocedure Evaluation (Addendum)
Anesthesia Evaluation  Patient identified by MRN, date of birth, ID band Patient awake    Reviewed: Allergy & Precautions, H&P , Patient's Chart, lab work & pertinent test results  Airway Mallampati: II TM Distance: >3 FB Neck ROM: full    Dental  (+) Teeth Intact   Pulmonary  clear to auscultation        Cardiovascular regular Normal    Neuro/Psych    GI/Hepatic   Endo/Other  Morbid obesity  Renal/GU      Musculoskeletal   Abdominal   Peds  Hematology   Anesthesia Other Findings       Reproductive/Obstetrics (+) Pregnancy                          Anesthesia Physical Anesthesia Plan  ASA: III  Anesthesia Plan: Epidural   Post-op Pain Management:    Induction:   Airway Management Planned:   Additional Equipment:   Intra-op Plan:   Post-operative Plan:   Informed Consent:   Plan Discussed with:   Anesthesia Plan Comments:         Anesthesia Quick Evaluation  

## 2010-12-14 NOTE — Progress Notes (Signed)
Candice Huffman is a 31 y.o. G3P1011 at [redacted]w[redacted]d by  admitted for active labor  Subjective: Pt received epidural just before 1700.  Comfortable now.  No c/o's.  No PIH s/s.  Future sister-in-law remains at bs.  Candice Huffman here earlier w/ pt's 2y.o. Son.  Pt did receive one dose of Stadol around 3pm, w/ some relief for about an hr.  She also had 1st dose of PCN around that same time.  Objective: BP 131/65  Pulse 90  Temp(Src) 97.8 F (36.6 C) (Oral)  Resp 20  Ht 5\' 4"  (1.626 m)  Wt 92.625 kg (204 lb 3.2 oz)  BMI 35.05 kg/m2      FHT:  FHR: 120 bpm, variability: moderate,  accelerations:  Present,  decelerations:  Absent UC:   regular, every 3-4 minutes SVE:   Dilation: 4.5 Effacement (%): 80 Station: -1 Exam by:: Huffman. Adlene Adduci CNM  Mucousy d/c at introitus; possibly leaking some fluid AROM sm-mod amt clear fluid Labs: Lab Results  Component Value Date   WBC 13.5* 12/14/2010   HGB 11.1* 12/14/2010   HCT 33.5* 12/14/2010   MCV 86.3 12/14/2010   PLT 202 12/14/2010    Assessment / Plan: Arrest in active phase of labor  Labor: AROM now for lack of continued cx progression since admission Preeclampsia:  no signs or symptoms of toxicity Fetal Wellbeing:  Category I Pain Control:  Epidural I/D:  n/a Anticipated MOD:  NSVD Will recheck in approximately 2 hrs, and if no significant change, will start Pitocin. TOLAC. Candice Huffman 12/14/2010, 6:04 PM

## 2010-12-15 ENCOUNTER — Encounter (HOSPITAL_COMMUNITY): Payer: Self-pay | Admitting: *Deleted

## 2010-12-15 DIAGNOSIS — O34219 Maternal care for unspecified type scar from previous cesarean delivery: Secondary | ICD-10-CM | POA: Diagnosis not present

## 2010-12-15 MED ORDER — MISOPROSTOL 200 MCG PO TABS
800.0000 ug | ORAL_TABLET | Freq: Once | ORAL | Status: AC
  Administered 2010-12-15: 800 ug via RECTAL

## 2010-12-15 MED ORDER — IBUPROFEN 600 MG PO TABS
600.0000 mg | ORAL_TABLET | Freq: Four times a day (QID) | ORAL | Status: DC
  Administered 2010-12-15 – 2010-12-17 (×7): 600 mg via ORAL
  Filled 2010-12-15 (×2): qty 1

## 2010-12-15 MED ORDER — OXYCODONE-ACETAMINOPHEN 5-325 MG PO TABS
1.0000 | ORAL_TABLET | ORAL | Status: DC | PRN
  Administered 2010-12-16 – 2010-12-17 (×3): 1 via ORAL
  Filled 2010-12-15 (×3): qty 1

## 2010-12-15 MED ORDER — ZOLPIDEM TARTRATE 5 MG PO TABS: 5.0000 mg | ORAL_TABLET | Freq: Every evening | ORAL | Status: DC | PRN

## 2010-12-15 MED ORDER — DIPHENHYDRAMINE HCL 25 MG PO CAPS: 25.0000 mg | ORAL_CAPSULE | Freq: Four times a day (QID) | ORAL | Status: DC | PRN

## 2010-12-15 MED ORDER — MISOPROSTOL 200 MCG PO TABS
ORAL_TABLET | ORAL | Status: AC
  Administered 2010-12-15: 800 ug via RECTAL
  Filled 2010-12-15: qty 4

## 2010-12-15 MED ORDER — FAMOTIDINE 20 MG PO TABS
40.0000 mg | ORAL_TABLET | Freq: Once | ORAL | Status: AC
  Administered 2010-12-16: 40 mg via ORAL
  Filled 2010-12-15: qty 2

## 2010-12-15 MED ORDER — DIBUCAINE 1 % RE OINT: 1.0000 "application " | TOPICAL_OINTMENT | RECTAL | Status: DC | PRN

## 2010-12-15 MED ORDER — ONDANSETRON HCL 4 MG/2ML IJ SOLN: 4.0000 mg | INTRAMUSCULAR | Status: DC | PRN

## 2010-12-15 MED ORDER — WITCH HAZEL-GLYCERIN EX PADS: 1.0000 "application " | MEDICATED_PAD | CUTANEOUS | Status: DC | PRN

## 2010-12-15 MED ORDER — PRENATAL PLUS 27-1 MG PO TABS: 1.0000 | ORAL_TABLET | Freq: Every day | ORAL | Status: DC

## 2010-12-15 MED ORDER — SIMETHICONE 80 MG PO CHEW: 80.0000 mg | CHEWABLE_TABLET | ORAL | Status: DC | PRN

## 2010-12-15 MED ORDER — TETANUS-DIPHTH-ACELL PERTUSSIS 5-2.5-18.5 LF-MCG/0.5 IM SUSP: 0.5000 mL | Freq: Once | INTRAMUSCULAR | Status: DC

## 2010-12-15 MED ORDER — FERROUS SULFATE 325 (65 FE) MG PO TABS
325.0000 mg | ORAL_TABLET | Freq: Two times a day (BID) | ORAL | Status: DC
  Administered 2010-12-15 – 2010-12-17 (×3): 325 mg via ORAL
  Filled 2010-12-15 (×3): qty 1

## 2010-12-15 MED ORDER — MEDROXYPROGESTERONE ACETATE 150 MG/ML IM SUSP: 150.0000 mg | INTRAMUSCULAR | Status: DC | PRN

## 2010-12-15 MED ORDER — BENZOCAINE-MENTHOL 20-0.5 % EX AERO: 1.0000 "application " | INHALATION_SPRAY | CUTANEOUS | Status: DC | PRN

## 2010-12-15 MED ORDER — SENNOSIDES-DOCUSATE SODIUM 8.6-50 MG PO TABS
2.0000 | ORAL_TABLET | Freq: Every day | ORAL | Status: DC
  Administered 2010-12-15 – 2010-12-16 (×2): 2 via ORAL

## 2010-12-15 MED ORDER — METOCLOPRAMIDE HCL 10 MG PO TABS
10.0000 mg | ORAL_TABLET | Freq: Once | ORAL | Status: AC
  Administered 2010-12-16: 10 mg via ORAL
  Filled 2010-12-15: qty 1

## 2010-12-15 MED ORDER — LACTATED RINGERS IV SOLN
INTRAVENOUS | Status: DC
  Administered 2010-12-16: 20 mL/h via INTRAVENOUS

## 2010-12-15 MED ORDER — ONDANSETRON HCL 4 MG PO TABS: 4.0000 mg | ORAL_TABLET | ORAL | Status: DC | PRN

## 2010-12-15 MED ORDER — LANOLIN HYDROUS EX OINT: TOPICAL_OINTMENT | CUTANEOUS | Status: DC | PRN

## 2010-12-15 MED ORDER — MAGNESIUM HYDROXIDE 400 MG/5ML PO SUSP: 30.0000 mL | ORAL | Status: DC | PRN

## 2010-12-15 NOTE — Progress Notes (Addendum)
Post Partum Day 0 Subjective: Feeling hot flash at present, but otherwise doing well--very tired.  Breastfeeding.  BTL planned for tomorrow.  Objective: Blood pressure 131/77, pulse 103, temperature 98.6 F (37 C), temperature source Oral, resp. rate 20, height 5\' 4"  (1.626 m), weight 92.625 kg (204 lb 3.2 oz), unknown if currently breastfeeding.  Temp max 100.9 at 3 am.  Currently afebrile.  Filed Vitals:   12/15/10 0403 12/15/10 0500 12/15/10 0600 12/15/10 1000  BP: 138/82 135/90 127/79 131/77  Pulse: 105 99 106 103  Temp:  99.3 F (37.4 C) 99 F (37.2 C) 98.6 F (37 C)  TempSrc:  Oral Oral Oral  Resp: 18 18 18 20   Height:      Weight:        Physical Exam:  General: alert and fatigued Lochia: appropriate Uterine Fundus: firm Incision: healing well DVT Evaluation: No evidence of DVT seen on physical exam. Negative Homan's sign.   Basename 12/14/10 1201  HGB 11.1*  HCT 33.5*    Assessment/Plan: Continue current care. Close observation of vital signs. Po hydration. For BTL tomorrow--NPO after midnight.   LOS: 1 day   Aliani Caccavale 12/15/2010, 11:30 AM

## 2010-12-15 NOTE — Progress Notes (Signed)
Subjective:  Pt currently laboring down again secondary to suspected OP/OT presentation and no progress after pushing for 1.5 hrs. Also pt's back started to hurt while pushing.  Placed in Rt exaggerated sims and epidural bolused.  Pt comfortable at present.   Objective: BP 135/83  Pulse 112  Temp(Src) 98.5 F (36.9 C) (Oral)  Resp 18  Ht 5\' 4"  (1.626 m)  Wt 92.625 kg (204 lb 3.2 oz)  BMI 35.05 kg/m2   Total I/O In: -  Out: 800 [Urine:800]  FHT:  FHR: 150-160 bpm, variability: moderate,  accelerations:  Abscent,  decelerations:  Present recurrent mod variables while pushing UC:   regular, every 2-4 minutes SVE:   Dilation: 10 Effacement (%): 100 Station: +1 Exam by:: a. harper, rnc-ob  Assessment / Plan: Second stage; no progress w/ pushing for 1.5 hrs.  Suspect OP/OT. Variables w/ pushing  Labor: Spontaneous except AROM at 5cm Preeclampsia:  no signs or symptoms of toxicity Fetal Wellbeing:  Category II Pain Control:  Epidural I/D:  n/a Anticipated MOD:  NSVD Will labor down, and if ctxs space, will start low dose pitocin. C/w MD prn.  When resume pushing, will push from side-lying position.   CTO for FHT closely.   Niomi Valent H 12/15/2010, 1:11 AM

## 2010-12-15 NOTE — Progress Notes (Signed)
Subjective: Pt currently pushing for about an hr.  Complete and +1 station around 2200, but labored down.  Ctxs still regular and spontaneous.  Since pushing, FHT w/ Moderate recurring variables.  Station still about +2 when not pushing, but pushes w/ good force.    Objective: BP 143/78  Pulse 100  Temp(Src) 98.5 F (36.9 C) (Oral)  Resp 18  Ht 5\' 4"  (1.626 m)  Wt 92.625 kg (204 lb 3.2 oz)  BMI 35.05 kg/m2   Total I/O In: -  Out: 800 [Urine:800]  FHT:  FHR: 140's bpm, variability: moderate,  accelerations:  Present,  decelerations:  Present recurring mild to moderate variables UC:   regular, every 2-3 minutes SVE:   Dilation: 10 Effacement (%): 100 Station: +1 Exam by:: a. harper, rnc-ob  Labs: Lab Results  Component Value Date   WBC 13.5* 12/14/2010   HGB 11.1* 12/14/2010   HCT 33.5* 12/14/2010   MCV 86.3 12/14/2010   PLT 202 12/14/2010   Assessment / Plan: Spontaneous labor, progressing normally  Labor: Progressing normally Preeclampsia:  no signs or symptoms of toxicity Fetal Wellbeing:  Category II Pain Control:  Epidural I/D:  n/a Anticipated MOD:  NSVD Pt has pushed in tilted positions more the last 30 minutes.  Still questionable OP/OT.   May allow pt to labor down again.   C/w MD prn.  Yahaira Bruski H 12/15/2010, 12:14 AM

## 2010-12-15 NOTE — Anesthesia Postprocedure Evaluation (Signed)
  Anesthesia Post-op Note  Patient: Candice Huffman  Procedure(s) Performed: * No procedures listed *  Patient Location: Mother/Baby  Anesthesia Type: Epidural  Level of Consciousness: awake, alert  and oriented  Airway and Oxygen Therapy: Patient Spontanous Breathing  Post-op Pain: none  Post-op Assessment: Post-op Vital signs reviewed, Patient's Cardiovascular Status Stable, No headache, No backache, No residual numbness and No residual motor weakness  Post-op Vital Signs: Reviewed and stable  Complications: No apparent anesthesia complications

## 2010-12-15 NOTE — Progress Notes (Signed)
Subjective: Resting. Family and visitors at [facility].  Epidural controlling pain.  Ctxs continue spontaneously  Objective: Total I/O In: -  Out: 800 [Urine:800] .Marland Kitchen Filed Vitals:   12/14/10 2233 12/14/10 2303 12/14/10 2312 12/14/10 2335  BP: 130/80 127/79  150/84  Pulse: 117 117  129  Temp:  98.5 F (36.9 C)    TempSrc:  Oral    Resp: 18 18 18 18   Height:      Weight:       FHT:  FHR: 130's bpm, variability: moderate,  accelerations:  Present,  decelerations:  Absent UC:   regular, every 2-4 minutes SVE:  6-7/90/-1; more cx on pt's Rt/ puffy anterior lip; during ctx, bulges to 7-8 cm w/ good force; OP presentation suspected Labs: Lab Results  Component Value Date   WBC 13.5* 12/14/2010   HGB 11.1* 12/14/2010   HCT 33.5* 12/14/2010   MCV 86.3 12/14/2010   PLT 202 12/14/2010   Assessment / Plan: Spontaneous labor, progressing normally  Labor: Progressing normally Preeclampsia:  no signs or symptoms of toxicity Fetal Wellbeing:  Category I Pain Control:  Epidural I/D:  n/a Anticipated MOD:  NSVD Possible OP presentation.  Will recheck in 2-3 hrs, and if no change, will insert IUPC and begin Pitocin. Enc'd lateral positions. Sundy Houchins H 12/15/2010, 12:06 AM

## 2010-12-15 NOTE — Progress Notes (Signed)
Delivery Note Resumed pushing around 0204 after laboring down in exaggerated Sims position.  Fetal station +2 to +3 w/ caput noted.  While laboring down, FHT did become tachycardic briefly, with FHR into 160s-180s; after the period of tachycardia, recurrent lates noted.  Ctxs continued spontaneously.  Once pushing restarted, O2 placed on pt, and remained on until delivery; pt also given IVFB.  I&O cath just before delivery--blood tinged and about 150cc.  Pushed in Lt tilted position.  Did rest with some pushes, and FHR w/ mild variable or early when not pushing.  FHR was in 150s for baseline during 2nd pushing efforts.  Pt pushed well to SVD at 2:45 AM a viable female "Coralee North" was delivered via VBAC, Spontaneous (Presentation: Left Occiput Anterior).  LNC x1 noted and unable to reduce over head, but reduced over shoulders.  Vtx rotated on perineum.  NB w/ spontaneous cry as body delivered, and placed on mom's abdomen where dried & stimulated.  Moderate meconium was noted as body delivered.  Newborn voided just after delivery as well.  Cord gases & cord blood were collected both for lab and donation.  APGAR: 8, 9; weight 6 lb 4 oz (2835 g).   Placenta status: Intact, Spontaneous, trailing membranes, Schultz, meconium stained.  Cord: 3 vessels with the following complications: None.  Cord pH: arterial=7.20 & venous=7.31.  Newborn's temp=100 after delivery.   Anesthesia: Epidural  Episiotomy: None Lacerations:bilateral Labial, and 1st degree vaginal--all hemostatic and not repaired Suture Repair: n/a Est. Blood Loss (mL): 350cc Uterine atony after placenta, so did place cytotec pr x1.    Mom to postpartum.  Baby to nursery-stable. Pt plans to breastfeed and desires BTL; will defer procedure until tomorrow so she can eat today. Will CTO BP's closely.  Repeat PIH labs tomorrow morning and NPO after MN tonight.   .. Filed Vitals:   12/15/10 0318 12/15/10 0333 12/15/10 0348 12/15/10 0403  BP: 144/75  142/82 149/78 138/82  Pulse: 108 110 122 105  Temp: 100.9 F (38.3 C)     TempSrc: Oral     Resp: 18 18 18 18   Height:      Weight:       Candice Huffman 12/15/2010, 4:14 AM

## 2010-12-16 ENCOUNTER — Encounter (HOSPITAL_COMMUNITY)
Admission: AD | Disposition: A | Discharge status: Home / Self Care | Payer: Self-pay | Source: Ambulatory Visit | Attending: Obstetrics and Gynecology

## 2010-12-16 ENCOUNTER — Inpatient Hospital Stay (HOSPITAL_COMMUNITY): Payer: No Typology Code available for payment source | Admitting: Anesthesiology

## 2010-12-16 ENCOUNTER — Encounter (HOSPITAL_COMMUNITY): Payer: Self-pay | Admitting: Anesthesiology

## 2010-12-16 ENCOUNTER — Other Ambulatory Visit: Payer: Self-pay | Admitting: Obstetrics and Gynecology

## 2010-12-16 DIAGNOSIS — Z302 Encounter for sterilization: Secondary | ICD-10-CM

## 2010-12-16 HISTORY — PX: TUBAL LIGATION: SHX77

## 2010-12-16 LAB — COMPREHENSIVE METABOLIC PANEL
AST: 27 U/L (ref 0–37)
Albumin: 2.3 g/dL — ABNORMAL LOW (ref 3.5–5.2)
Alkaline Phosphatase: 105 U/L (ref 39–117)
CO2: 24 mEq/L (ref 19–32)
Chloride: 106 mEq/L (ref 96–112)
Potassium: 3.5 mEq/L (ref 3.5–5.1)
Total Bilirubin: 0.3 mg/dL (ref 0.3–1.2)

## 2010-12-16 LAB — CBC
HCT: 28.5 % — ABNORMAL LOW (ref 36.0–46.0)
Platelets: 174 10*3/uL (ref 150–400)
RBC: 3.27 MIL/uL — ABNORMAL LOW (ref 3.87–5.11)
RDW: 13.5 % (ref 11.5–15.5)
WBC: 13.8 10*3/uL — ABNORMAL HIGH (ref 4.0–10.5)

## 2010-12-16 SURGERY — LIGATION, FALLOPIAN TUBE, POSTPARTUM
Anesthesia: Epidural | Laterality: Bilateral

## 2010-12-16 MED ORDER — MIDAZOLAM HCL 5 MG/5ML IJ SOLN
INTRAMUSCULAR | Status: DC | PRN
  Administered 2010-12-16: 2 mg via INTRAVENOUS

## 2010-12-16 MED ORDER — SODIUM BICARBONATE 8.4 % IV SOLN
INTRAVENOUS | Status: DC | PRN
  Administered 2010-12-16: 3 mL via EPIDURAL

## 2010-12-16 MED ORDER — METOCLOPRAMIDE HCL 5 MG/ML IJ SOLN: 10.0000 mg | Freq: Once | INTRAMUSCULAR | Status: DC | PRN

## 2010-12-16 MED ORDER — LIDOCAINE HCL (CARDIAC) 20 MG/ML IV SOLN
INTRAVENOUS | Status: AC
  Filled 2010-12-16: qty 5

## 2010-12-16 MED ORDER — LIDOCAINE-EPINEPHRINE (PF) 2 %-1:200000 IJ SOLN
INTRAMUSCULAR | Status: AC
  Filled 2010-12-16: qty 20

## 2010-12-16 MED ORDER — BUPIVACAINE HCL (PF) 0.25 % IJ SOLN
INTRAMUSCULAR | Status: DC | PRN
  Administered 2010-12-16: 10 mL

## 2010-12-16 MED ORDER — SODIUM BICARBONATE 8.4 % IV SOLN
INTRAVENOUS | Status: AC
  Filled 2010-12-16: qty 50

## 2010-12-16 MED ORDER — FENTANYL CITRATE 0.05 MG/ML IJ SOLN
INTRAMUSCULAR | Status: AC
  Filled 2010-12-16: qty 5

## 2010-12-16 MED ORDER — FENTANYL CITRATE 0.05 MG/ML IJ SOLN
INTRAMUSCULAR | Status: DC | PRN
  Administered 2010-12-16 (×3): 50 ug via INTRAVENOUS

## 2010-12-16 MED ORDER — MIDAZOLAM HCL 2 MG/2ML IJ SOLN
INTRAMUSCULAR | Status: AC
  Filled 2010-12-16: qty 2

## 2010-12-16 MED ORDER — KETOROLAC TROMETHAMINE 30 MG/ML IJ SOLN
INTRAMUSCULAR | Status: AC
  Filled 2010-12-16: qty 1

## 2010-12-16 MED ORDER — MEPERIDINE HCL 25 MG/ML IJ SOLN: 6.2500 mg | INTRAMUSCULAR | Status: DC | PRN

## 2010-12-16 MED ORDER — KETOROLAC TROMETHAMINE 30 MG/ML IJ SOLN
INTRAMUSCULAR | Status: DC | PRN
  Administered 2010-12-16: 30 mg via INTRAVENOUS

## 2010-12-16 MED ORDER — FENTANYL CITRATE 0.05 MG/ML IJ SOLN: 25.0000 ug | INTRAMUSCULAR | Status: DC | PRN

## 2010-12-16 SURGICAL SUPPLY — 24 items
BLADE HEX COATED 2.75 (ELECTRODE) IMPLANT
CHLORAPREP W/TINT 26ML (MISCELLANEOUS) ×2 IMPLANT
CLOTH BEACON ORANGE TIMEOUT ST (SAFETY) ×2 IMPLANT
CONTAINER PREFILL 10% NBF 15ML (MISCELLANEOUS) ×4 IMPLANT
DERMABOND ADVANCED (GAUZE/BANDAGES/DRESSINGS) ×1
DERMABOND ADVANCED .7 DNX12 (GAUZE/BANDAGES/DRESSINGS) ×1 IMPLANT
DRAPE UTILITY XL STRL (DRAPES) ×2 IMPLANT
DRSG COVADERM PLUS 2X2 (GAUZE/BANDAGES/DRESSINGS) ×4 IMPLANT
ELECT REM PT RETURN 9FT ADLT (ELECTROSURGICAL) ×2
ELECTRODE REM PT RTRN 9FT ADLT (ELECTROSURGICAL) ×1 IMPLANT
GLOVE SURG SS PI 6.5 STRL IVOR (GLOVE) ×4 IMPLANT
GOWN PREVENTION PLUS LG XLONG (DISPOSABLE) ×4 IMPLANT
NEEDLE HYPO 22GX1.5 SAFETY (NEEDLE) ×4 IMPLANT
NS IRRIG 1000ML POUR BTL (IV SOLUTION) ×2 IMPLANT
PACK ABDOMINAL MINOR (CUSTOM PROCEDURE TRAY) ×2 IMPLANT
PENCIL BUTTON HOLSTER BLD 10FT (ELECTRODE) ×2 IMPLANT
SPONGE LAP 4X18 X RAY DECT (DISPOSABLE) IMPLANT
SUT CHROMIC 2 0 SH (SUTURE) ×2 IMPLANT
SUT VIC AB 0 CT2 27 (SUTURE) ×2 IMPLANT
SUT VIC AB 3-0 X1 27 (SUTURE) IMPLANT
SYR BULB IRRIGATION 50ML (SYRINGE) IMPLANT
SYR CONTROL 10ML LL (SYRINGE) ×2 IMPLANT
TOWEL OR 17X24 6PK STRL BLUE (TOWEL DISPOSABLE) ×4 IMPLANT
WATER STERILE IRR 1000ML POUR (IV SOLUTION) ×2 IMPLANT

## 2010-12-16 NOTE — Anesthesia Preprocedure Evaluation (Signed)
Anesthesia Evaluation  Patient identified by MRN, date of birth, ID band Patient awake    Reviewed: Allergy & Precautions, H&P , NPO status , Patient's Chart, lab work & pertinent test results  Airway Mallampati: II TM Distance: <3 FB Neck ROM: full    Dental  (+) Teeth Intact   Pulmonary  clear to auscultation        Cardiovascular regular Normal    Neuro/Psych    GI/Hepatic   Endo/Other  Morbid obesity  Renal/GU      Musculoskeletal   Abdominal   Peds  Hematology   Anesthesia Other Findings       Reproductive/Obstetrics (+) Pregnancy                           Anesthesia Physical  Anesthesia Plan  ASA: III  Anesthesia Plan: Epidural   Post-op Pain Management:    Induction:   Airway Management Planned:   Additional Equipment:   Intra-op Plan:   Post-operative Plan:   Informed Consent:   Plan Discussed with:   Anesthesia Plan Comments:         Anesthesia Quick Evaluation

## 2010-12-16 NOTE — H&P (Signed)
  The patient was seen and examined.  She is now post partum.  She has no complaints. O:  Afebrile.VSS           Abdomen soft.  Fundus 2 fingers below umbilicus A:  Stable postpartum      Desires sterilization P:  PPBTL per patient request      I have reviewed the risks and benefits including the risk of tubal failure resulting in subsequent pregnancy      She wishes to proceed

## 2010-12-16 NOTE — Transfer of Care (Signed)
Immediate Anesthesia Transfer of Care Note  Patient: Candice Huffman  Procedure(s) Performed:  POST PARTUM TUBAL LIGATION  Patient Location: PACU  Anesthesia Type: Epidural  Level of Consciousness: awake, alert  and oriented  Airway & Oxygen Therapy: Patient Spontanous Breathing  Post-op Assessment: Report given to PACU RN and Post -op Vital signs reviewed and stable  Post vital signs: Reviewed and stable  Complications: No apparent anesthesia complications

## 2010-12-16 NOTE — Op Note (Signed)
12/14/2010 - 12/16/2010  10:11 AM  PATIENT:  Candice Huffman  30 y.o. female  PRE-OPERATIVE DIAGNOSIS:  desires sterilization  POST-OPERATIVE DIAGNOSIS:  desires sterilization  PROCEDURE:  Procedure(S): POST PARTUM TUBAL LIGATION  SURGEON:  Surgeon(s): Hal Morales, MD  ASSISTANTS: none   ANESTHESIA:   epidural  ESTIMATED BLOOD LOSS: * No blood loss amount entered *   BLOOD ADMINISTERED:none  LOCAL MEDICATIONS USED:  MARCAINE 10CC  COMPLICATIONS:none  FINDINGS:normal tube for the postpartum state  SPECIMEN:  Source of Specimen:  portions of right and left tubes  DISPOSITION OF SPECIMEN:  PATHOLOGY  COUNTS:  YES correct  DESCRIPTION OF PROCEDURE: the patient was taken to the operating room after appropriate identification and placed on the operating table. The abdomen was prepped with multiple layers of chloraprep and draped as a sterile field, after a foley catheter was inserted under sterile conditions. Subumbilical injection of 10 cc of quarter percent Marcaine was undertaken. A subumbilical incision was made and the peritoneum entered with a combination of blunt and sharp dissection.  The left fallopian tube was then identified, followed to its fimbriated end, and grasped at the isthmic portion and elevated. A suture of 2-0 chromic was placed through the mesosalpinx and tied fore and aft on the tube. A second ligature was placed proximal to that and the intervening knuckle of tube excised. The cut ends were cauterized. A similar procedure was carried out on the opposite side. Hemostasis was noted to be adequate.  The abdominal peritoneum was closed in pursestring fashion with a suture of 0 Vicryl. The fascia was closed with a suture of 0 Vicryl in a running fashion. The subumbilical incision was closed with Derma Bond. A sterile dressing was applied and the patient taken from the operating room to the recovery room in satisfactory condition having tolerated the  procedure well the sponge and instrument counts correct.  PLAN OF CARE: PACU  PATIENT DISPOSITION:  PACU - hemodynamically stable.   Delay start of Pharmacological VTE agent (>24hrs) due to surgical blood loss or risk of bleeding:  No, patient had SCD hose placed at the time of pre op.

## 2010-12-16 NOTE — Anesthesia Postprocedure Evaluation (Signed)
  Anesthesia Post-op Note  Patient: Candice Huffman  Procedure(s) Performed:  POST PARTUM TUBAL LIGATION  Patient Location: PACU  Anesthesia Type: Epidural  Level of Consciousness: awake, alert  and oriented  Airway and Oxygen Therapy: Patient Spontanous Breathing  Post-op Pain: none  Post-op Assessment: Post-op Vital signs reviewed, Patient's Cardiovascular Status Stable, Respiratory Function Stable, Patent Airway, No signs of Nausea or vomiting, Pain level controlled, No headache, No backache, No residual numbness and No residual motor weakness  Post-op Vital Signs: Reviewed and stable  Complications: No apparent anesthesia complications

## 2010-12-17 ENCOUNTER — Encounter (HOSPITAL_COMMUNITY): Payer: Self-pay | Admitting: Obstetrics and Gynecology

## 2010-12-17 MED ORDER — OXYCODONE-ACETAMINOPHEN 5-325 MG PO TABS: 1.0000 | ORAL_TABLET | ORAL | Status: AC | PRN

## 2010-12-17 MED ORDER — IBUPROFEN 600 MG PO TABS: 600.0000 mg | ORAL_TABLET | Freq: Four times a day (QID) | ORAL | Status: AC | PRN

## 2010-12-17 NOTE — Progress Notes (Addendum)
Post Partum Day 2 Subjective: no complaints, up ad lib without syncope, voiding, tolerating PO, + flatus, denies HA/N/V or RUQ pain Pain well controlled with po meds, some incisional pain at umbilicus,  BF well Mood stable, bonding well   Objective: Blood pressure 145/84, pulse 97, temperature 98.6 F (37 C), temperature source Oral, resp. rate 16, height 5\' 4"  (1.626 m), weight 91.8 kg (202 lb 6.1 oz), SpO2 99.00%, unknown if currently breastfeeding. Pt is BF Temp:  [98 F (36.7 C)-99 F (37.2 C)] 98.6 F (37 C) (11/07 0945) Pulse Rate:  [84-99] 97  (11/07 0945) Resp:  [16-20] 16  (11/07 0945) BP: (109-145)/(71-86) 145/84 mmHg (11/07 0945) SpO2:  [99 %-100 %] 99 % (11/07 0147) Weight:  [91.8 kg (202 lb 6.1 oz)] 202 lb 6.1 oz (91.8 kg) (11/07 0547)   Physical Exam:  General: alert and no distress Lungs: CTAB Heart: RRR Breasts: soft, nipples intact Lochia: appropriate Uterine Fundus: firm Perineum: WNL DVT Evaluation: No evidence of DVT seen on physical exam. Negative Homan's sign. Mild LE edema, reflexes 2+, neg clonus   Basename 12/16/10 0015 12/14/10 1201  HGB 9.4* 11.1*  HCT 28.5* 33.5*    Assessment/Plan: Discharge home, Breastfeeding, Lactation consult and Contraception BTL Mild GHTN, no s/s PIH, PIH labs WNL yesterday Anemia  Smart start nurse to take BP on 11/9 F/u in office in 1-2 wks for repeat BP      LOS: 3 days   LILLARD,SHELLEY M 12/17/2010, 11:48 AM     Agree with above - AYR

## 2010-12-17 NOTE — Discharge Summary (Signed)
   Obstetric Discharge Summary Reason for Admission: onset of labor Prenatal Procedures: ultrasound Intrapartum Procedures: GBS prophylaxis and VBAC, epidural Postpartum Procedures: none BTL  Complications-Operative and Postpartum: none  Temp:  [98.2 F (36.8 C)-99 F (37.2 C)] 98.6 F (37 C) (11/07 0945) Pulse Rate:  [89-99] 97  (11/07 0945) Resp:  [16-20] 16  (11/07 0945) BP: (109-145)/(71-86) 145/84 mmHg (11/07 0945) SpO2:  [99 %] 99 % (11/07 0147) Weight:  [91.8 kg (202 lb 6.1 oz)] 202 lb 6.1 oz (91.8 kg) (11/07 0547) Hemoglobin  Date Value Range Status  12/16/2010 9.4* 12.0-15.0 (g/dL) Final     HCT  Date Value Range Status  12/16/2010 28.5* 36.0-46.0 (%) Final    Hospital Course:  Pt arrived in early/active labor and received epidural, as well as PCN for GBS prophylaxis, she had an SVD without complications and had a routine PP course  Discharge Diagnoses: Term Pregnancy-delivered and VBAC  Discharge Information: Date: 12/17/2010 Activity: pelvic rest Diet: routine Medications:  Medication List  As of 12/17/2010  2:05 PM   START taking these medications         ibuprofen 600 MG tablet   Commonly known as: ADVIL,MOTRIN   Take 1 tablet (600 mg total) by mouth every 6 (six) hours as needed for pain (pain scale < 4).      oxyCODONE-acetaminophen 5-325 MG per tablet   Commonly known as: PERCOCET   Take 1-2 tablets by mouth every 3 (three) hours as needed (moderate - severe pain).         CONTINUE taking these medications         prenatal vitamin w/FE, FA 27-1 MG Tabs         STOP taking these medications         acetaminophen-codeine 300-30 MG per tablet      pseudoephedrine 30 MG tablet          Where to get your medications    These are the prescriptions that you need to pick up.   You may get these medications from any pharmacy.         ibuprofen 600 MG tablet   oxyCODONE-acetaminophen 5-325 MG per tablet           Condition:  stable Instructions: refer to practice specific booklet, pt to have smart-start nurse to take BP on 11/9, PIH precautions given Discharge to: home Follow-up Information    Follow up with Ian Castagna M, CNM. (1-2 wks at [facility] for BP check, call for appt)    Contact information:   [address] [place] [address] [phone]          Newborn Data: Live born  Information for the patient's newborn:  Desrosier, Girl Breezy [[id number]]  female apgars 8, 9 wgt 6#4oz Home with mother.  Sun Kihn M 12/17/2010, 2:05 PM

## 2010-12-18 NOTE — Progress Notes (Signed)
UR Chart review completed.  

## 2011-01-12 NOTE — Progress Notes (Signed)
Summary: SORE THROAT AND EAR PAIN   Vital Signs:  Patient Profile:   30 Years Old Female CC:      sore throat, right ear pain x 3 days Height:     64 inches Weight:      164 pounds O2 Sat:      99 % O2 treatment:    Room Air Temp:     99.4 degrees F oral Pulse rate:   87 / minute Resp:     16 per minute BP sitting:   139 / 84  (left arm) Cuff size:   regular  Vitals Entered By: Lajean Saver RN (May 28, 2010 9:58 AM)                  Updated Prior Medication List: PRENATAL/FOLIC ACID  TABS (PRENATAL VIT-FE FUMARATE-FA) once daily  Current Allergies: No known allergies History of Present Illness Chief Complaint: sore throat, right ear pain x 3 days History of Present Illness:  Subjective: Patient complains of URI symptoms that started 3 days ago with a mild sore throat.  She is [redacted] weeks pregnant + cough for one day No pleuritic pain No wheezing + nasal congestion ? post-nasal drainage No sinus pain/pressure No itchy/red eyes No earache No hemoptysis No SOB No fever/chills No nausea No vomiting No abdominal pain No diarrhea No skin rashes + fatigue No myalgias No headache    REVIEW OF SYSTEMS Constitutional Symptoms      Denies fever, chills, night sweats, weight loss, weight gain, and fatigue.  Eyes       Denies change in vision, eye pain, eye discharge, glasses, contact lenses, and eye surgery. Ear/Nose/Throat/Mouth       Complains of ear pain and sore throat.      Denies hearing loss/aids, change in hearing, ear discharge, dizziness, frequent runny nose, frequent nose bleeds, sinus problems, hoarseness, and tooth pain or bleeding.  Respiratory       Denies dry cough, productive cough, wheezing, shortness of breath, asthma, bronchitis, and emphysema/COPD.  Cardiovascular       Denies murmurs, chest pain, and tires easily with exhertion.    Gastrointestinal       Denies stomach pain, nausea/vomiting, diarrhea, constipation, blood in bowel  movements, and indigestion. Genitourniary       Denies painful urination, kidney stones, and loss of urinary control. Neurological       Denies paralysis, seizures, and fainting/blackouts. Musculoskeletal       Denies muscle pain, joint pain, joint stiffness, decreased range of motion, redness, swelling, muscle weakness, and gout.  Skin       Denies bruising, unusual mles/lumps or sores, and hair/skin or nail changes.  Psych       Denies mood changes, temper/anger issues, anxiety/stress, speech problems, depression, and sleep problems. Other Comments: Co-worker with strep. Denies fever   Past History:  Past Medical History: [redacted] weeks gestation  Past Surgical History: Caesarean section Tonsillectomy  Family History: Family History Hypertension- mother  Social History: Never Smoked Alcohol use-no Drug use-no EngagedSmoking Status:  never Drug Use:  no   Objective:  Appearance:  Patient appears healthy, stated age, and in no acute distress  Eyes:  Pupils are equal, round, and reactive to light and accomdation.  Extraocular movement is intact.  Conjunctivae are not inflamed.  Ears:  Canals normal.  Tympanic membranes normal.   Nose:  Mildly congested; no sinus tenderness Pharynx:  Normal  Neck:  Supple.  Slightly tender shotty posterior nodes  are palpated bilaterally.  Lungs:  Clear to auscultation.  Breath sounds are equal.  Heart:  Regular rate and rhythm without murmurs, rubs, or gallops.  Abdomen:  Gravid, nontender without masses or hepatosplenomegaly.  Bowel sounds are present.  No CVA or flank tenderness.  Extremities:  No edema.   Rapid strep test negative  Assessment New Problems: ACUTE PHARYNGITIS (ICD-462)  NO EVIDENCE BACTERIAL INFECTION TODAY  Plan New Orders: T-Culture, Throat [[id number]] Rapid Strep [09811] New Patient Level II [91478] Planning Comments:   Throat culture pending. Treat symptomatically for now:  Robitussin, increased fluids,  saline nasal irrigation. Follow-up with OB   The patient and/or caregiver has been counseled thoroughly with regard to medications prescribed including dosage, schedule, interactions, rationale for use, and possible side effects and they verbalize understanding.  Diagnoses and expected course of recovery discussed and will return if not improved as expected or if the condition worsens. Patient and/or caregiver verbalized understanding.   Orders Added: 1)  T-Culture, Throat [[id number]] 2)  Rapid Strep [57846] 3)  New Patient Level II [99202]    Laboratory Results  Date/Time Received: May 28, 2010 10:00 AM  Date/Time Reported: May 28, 2010 10:00 AM   Other Tests  Rapid Strep: negative  Kit Test Internal QC: Negative   (Normal Range: Negative)

## 2011-01-12 NOTE — Letter (Signed)
Summary: Out of Work  [facility]  [address]   [place], [place] [address]   Phone: [phone]  Fax: [phone]    May 28, 2010   Employee:  Latoya L Moede    To Whom It May Concern:   For Medical reasons, please excuse the above named employee from work today.   If you need additional information, please feel free to contact our office.         Sincerely,    Donna Christen MD

## 2011-01-12 NOTE — Telephone Encounter (Signed)
  Phone Note Outgoing Call   Call placed by: Linton Flemings RN,  May 31, 2010 3:24 PM Call placed to: Patient Summary of Call: called, no answer, msg. to come in or call with questions/concerns Initial call taken by: Linton Flemings RN,  May 31, 2010 3:25 PM

## 2011-04-09 ENCOUNTER — Encounter: Payer: Self-pay | Admitting: Emergency Medicine

## 2011-04-09 ENCOUNTER — Emergency Department
Admission: EM | Admit: 2011-04-09 | Discharge: 2011-04-09 | Disposition: A | Discharge status: Home / Self Care | Payer: No Typology Code available for payment source | Source: Home / Self Care | Attending: Emergency Medicine | Admitting: Emergency Medicine

## 2011-04-09 DIAGNOSIS — H109 Unspecified conjunctivitis: Secondary | ICD-10-CM

## 2011-04-09 DIAGNOSIS — J069 Acute upper respiratory infection, unspecified: Secondary | ICD-10-CM

## 2011-04-09 LAB — POCT RAPID STREP A (OFFICE): Rapid Strep A Screen: NEGATIVE

## 2011-04-09 MED ORDER — CIPROFLOXACIN HCL 0.3 % OP SOLN: 1.0000 [drp] | OPHTHALMIC | Status: AC

## 2011-04-09 NOTE — ED Notes (Signed)
Sore throat x 1 week, Left eye red and draining x 2 days

## 2011-04-09 NOTE — ED Provider Notes (Signed)
History     CSN: [id number]  Arrival date & time 04/09/11  4098   None     Chief Complaint  Patient presents with  . Sore Throat    (Consider location/radiation/quality/duration/timing/severity/associated sxs/prior treatment) HPI Candice Huffman is a 31 y.o. female who complains of onset of cold symptoms for 7 days.  + sore throat No cough No pleuritic pain No wheezing + nasal congestion +o post-nasal drainage No sinus pain/pressure No chest congestion + itchy/red eyes (see below) No earache No hemoptysis No SOB No chills/sweats No fever No nausea No vomiting No abdominal pain No diarrhea No skin rashes No fatigue No myalgias No headache   Candice Huffman presents today with an EYE COMPLAINT  Location: left  Onset: 5  Days   Symptoms: Redness: yes Discharge: yes Pain: no Photophobia: no Decreased Vision: no URI symptoms: yes Itching/Allergy sxs: no Glaucoma: no Recent eye surgery: no Contact lens use: yes but not in the last week  Red Flags Trauma: no Foreign Body: no Vomiting/HA: no Halos around lights: no Chickenpox or zoster: no      Past Medical History  Diagnosis Date  . Abnormal Pap smear   . Pregnancy induced hypertension     Past Surgical History  Procedure Date  . Cesarean section   . Tonsillectomy   . Tubal ligation 12/16/2010    Procedure: POST PARTUM TUBAL LIGATION;  Surgeon: Hal Morales, MD;  Location: [facility];  Service: Gynecology;  Laterality: Bilateral;    No family history on file.  History  Substance Use Topics  . Smoking status: Not on file  . Smokeless tobacco: Not on file  . Alcohol Use: No    OB History    Grav Para Term Preterm Abortions TAB SAB Ect Mult Living   3 2 2  0 1 0 1 0 0 2      Review of Systems  All other systems reviewed and are negative.    Allergies  Review of patient's allergies indicates not on file.  Home Medications   Current Outpatient Rx  Name Route Sig Dispense Refill  .  PRENATAL PLUS 27-1 MG PO TABS Oral Take 1 tablet by mouth daily.        BP 136/92  Pulse 94  Temp(Src) 98.8 F (37.1 C) (Oral)  Resp 16  Ht 5\' 4"  (1.626 m)  Wt 172 lb (78.019 kg)  BMI 29.52 kg/m2  SpO2 100%  Breastfeeding? Unknown  Physical Exam  Nursing note and vitals reviewed. Constitutional: She is oriented to person, place, and time. She appears well-developed and well-nourished.  HENT:  Head: Normocephalic and atraumatic.  Right Ear: Tympanic membrane, external ear and ear canal normal.  Left Ear: Tympanic membrane, external ear and ear canal normal.  Nose: Mucosal edema and rhinorrhea present.  Mouth/Throat: Posterior oropharyngeal erythema present. No oropharyngeal exudate or posterior oropharyngeal edema.  Eyes: EOM are normal. Pupils are equal, round, and reactive to light. Left eye exhibits discharge. Left conjunctiva is injected. No scleral icterus.  Neck: Neck supple.  Cardiovascular: Regular rhythm and normal heart sounds.   Pulmonary/Chest: Effort normal and breath sounds normal. No respiratory distress.  Neurological: She is alert and oriented to person, place, and time.  Skin: Skin is warm and dry.  Psychiatric: She has a normal mood and affect. Her speech is normal.    ED Course  Procedures (including critical care time)  Labs Reviewed - No data to display No results found.   1. Acute upper respiratory infections  of unspecified site   2. Conjunctivitis       MDM  1)  Take the prescribed antibiotic drops as instructed.  Rapid strep negative.  Culture pending.  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   2)  Use nasal saline solution (over the counter) at least 3 times a day. 3)  Use over the counter decongestants like Zyrtec-D every 12 hours as needed to help with congestion.  If you have hypertension, do not take medicines with sudafed.  4)  Can take tylenol every 6 hours or motrin every 8 hours for pain or fever. 5)  Follow  up with your primary doctor if no improvement in 5-7 days, sooner if increasing pain, fever, or new symptoms.    6) Wash hands, throw out current contacts and eye makeup  Lily Kocher, MD 04/09/11 [phone]

## 2011-04-10 LAB — STREP A DNA PROBE: GASP: NEGATIVE

## 2011-05-01 ENCOUNTER — Encounter: Payer: No Typology Code available for payment source | Admitting: Registered Nurse

## 2011-05-07 ENCOUNTER — Encounter: Payer: No Typology Code available for payment source | Admitting: Registered Nurse

## 2012-11-23 IMAGING — CR DG CHEST 2V
2 series · 2 of 2 positions shown · non-contrast
Comparison: None.

CLINICAL DATA: Cough.  Congestion.  Chest pain.  33 weeks pregnant.
Nonsmoker.

CHEST - 2 VIEW

[view not recorded (1 of 2)]
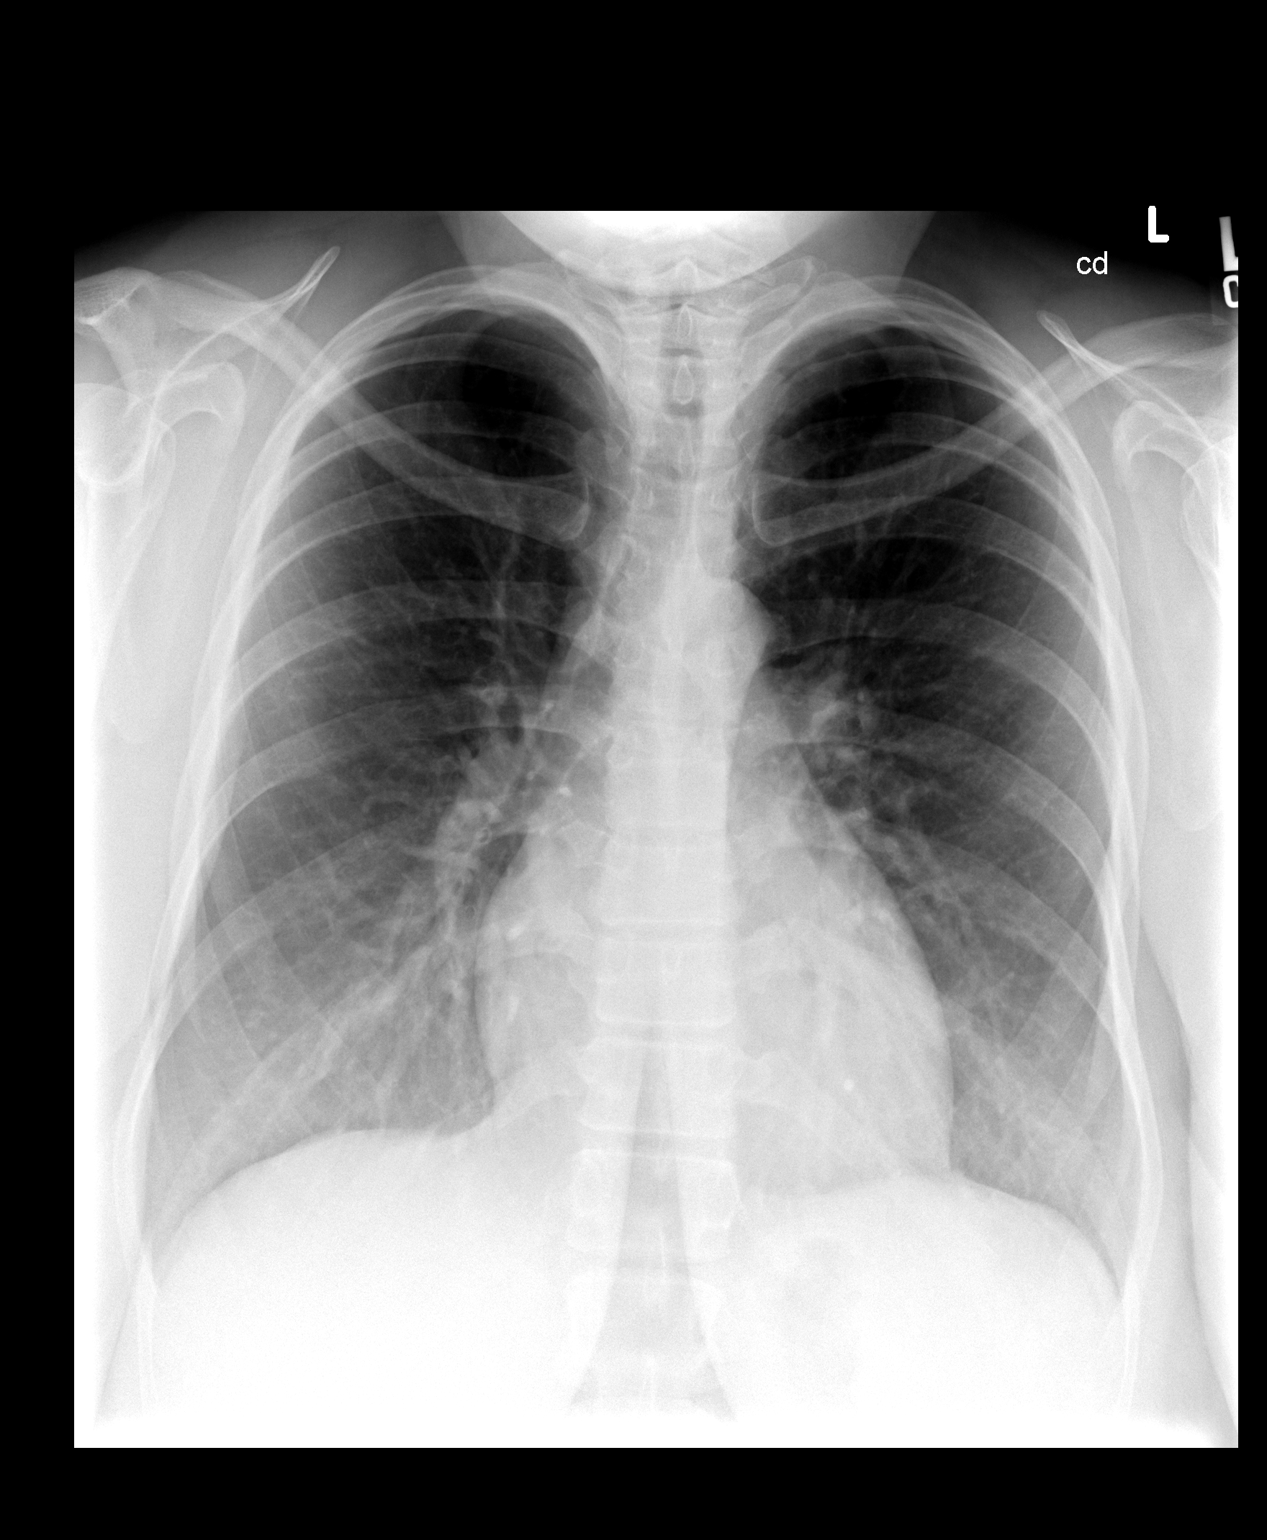

[view not recorded (2 of 2)]
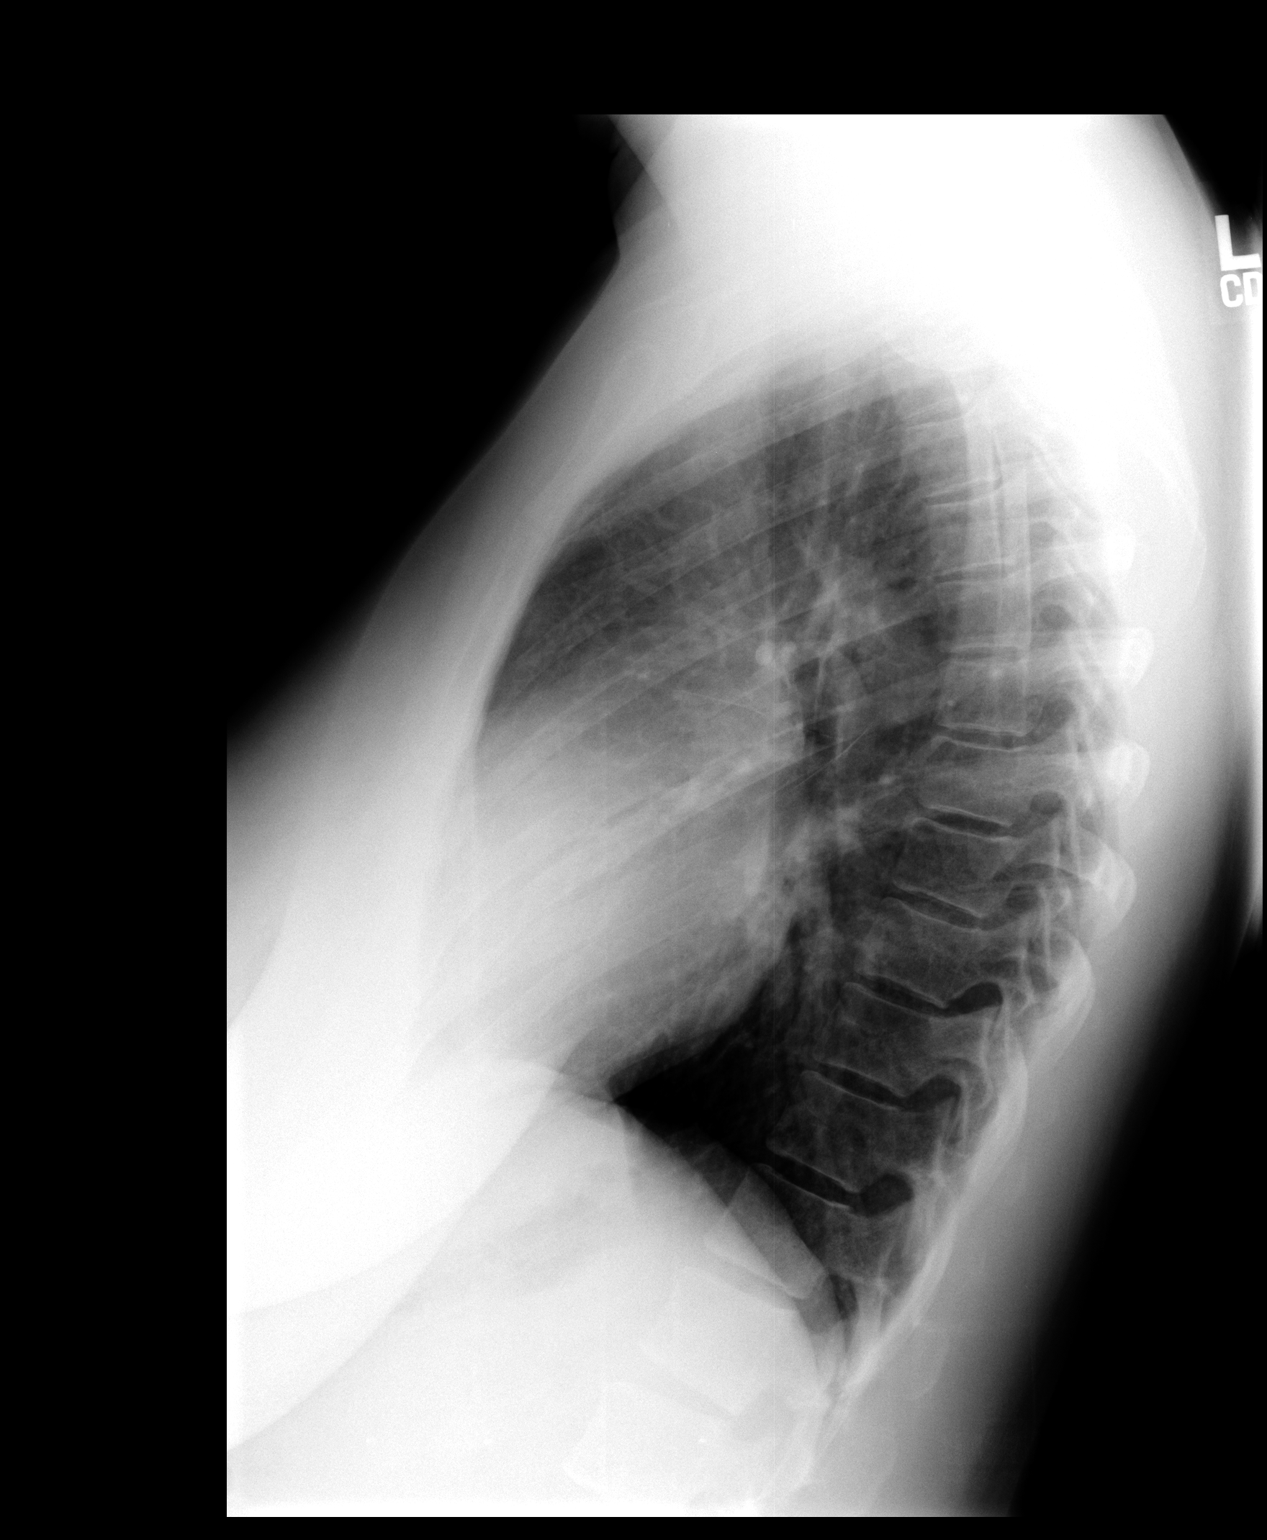

[2 of 2 positions shown; findings below may reference images not displayed]

FINDINGS: Minimal pectus excavatum deformity. Midline trachea.
Normal heart size and mediastinal contours. No pleural effusion or
pneumothorax.  Increased density over the lung bases on the frontal
is secondary to overlying breast tissue.  Lungs otherwise clear.
IMPRESSION: No acute cardiopulmonary disease.

## 2013-12-11 ENCOUNTER — Encounter: Payer: Self-pay | Admitting: Emergency Medicine

## 2014-07-11 ENCOUNTER — Encounter (HOSPITAL_BASED_OUTPATIENT_CLINIC_OR_DEPARTMENT_OTHER): Payer: Self-pay | Admitting: Emergency Medicine

## 2014-07-11 ENCOUNTER — Emergency Department (HOSPITAL_BASED_OUTPATIENT_CLINIC_OR_DEPARTMENT_OTHER)
Admission: EM | Admit: 2014-07-11 | Discharge: 2014-07-11 | Disposition: A | Discharge status: Home / Self Care | Payer: No Typology Code available for payment source | Attending: Emergency Medicine | Admitting: Emergency Medicine

## 2014-07-11 DIAGNOSIS — Y92410 Unspecified street and highway as the place of occurrence of the external cause: Secondary | ICD-10-CM | POA: Diagnosis not present

## 2014-07-11 DIAGNOSIS — G4489 Other headache syndrome: Secondary | ICD-10-CM | POA: Insufficient documentation

## 2014-07-11 DIAGNOSIS — Y998 Other external cause status: Secondary | ICD-10-CM | POA: Insufficient documentation

## 2014-07-11 DIAGNOSIS — Y9389 Activity, other specified: Secondary | ICD-10-CM | POA: Diagnosis not present

## 2014-07-11 DIAGNOSIS — Z79899 Other long term (current) drug therapy: Secondary | ICD-10-CM | POA: Diagnosis not present

## 2014-07-11 DIAGNOSIS — S0990XA Unspecified injury of head, initial encounter: Secondary | ICD-10-CM | POA: Insufficient documentation

## 2014-07-11 MED ORDER — CYCLOBENZAPRINE HCL 5 MG PO TABS: 5.0000 mg | ORAL_TABLET | Freq: Two times a day (BID) | ORAL | Status: DC | PRN

## 2014-07-11 MED ORDER — IBUPROFEN 800 MG PO TABS: 800.0000 mg | ORAL_TABLET | Freq: Three times a day (TID) | ORAL | Status: DC
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
## 2014-07-11 NOTE — Discharge Instructions (Signed)

## 2022-11-24 ENCOUNTER — Encounter (HOSPITAL_COMMUNITY): Payer: Self-pay | Admitting: Emergency Medicine

## 2022-11-24 ENCOUNTER — Ambulatory Visit (INDEPENDENT_AMBULATORY_CARE_PROVIDER_SITE_OTHER): Payer: 59

## 2022-11-24 ENCOUNTER — Ambulatory Visit (HOSPITAL_COMMUNITY)
Admission: EM | Admit: 2022-11-24 | Discharge: 2022-11-24 | Disposition: A | Discharge status: Home / Self Care | Payer: 59 | Attending: Family Medicine | Admitting: Family Medicine

## 2022-11-24 DIAGNOSIS — R030 Elevated blood-pressure reading, without diagnosis of hypertension: Secondary | ICD-10-CM | POA: Diagnosis not present

## 2022-11-24 DIAGNOSIS — R079 Chest pain, unspecified: Secondary | ICD-10-CM

## 2022-11-24 NOTE — ED Triage Notes (Signed)
Pt c/o intermit chest pains that last less than 1 min. She took her bp ago and it was 175/121. Pt symptoms began today

## 2022-11-24 NOTE — ED Notes (Signed)
Patient is being discharged from the Urgent Care and sent to the Emergency Department via POV . Per Mardella Layman, MD, patient is in need of higher level of care due to chest pain. Patient is aware and verbalizes understanding of plan of care.  Vitals:   11/24/22 1229  BP: (!) 170/98  Pulse: 91  Resp: 17  Temp: 98.2 F (36.8 C)  SpO2: 96%

## 2022-11-24 NOTE — ED Provider Notes (Signed)
[facility]   [id number] 11/24/22 Arrival Time: 1213  ASSESSMENT & PLAN:  1. Chest pain, unspecified type   2. Elevated blood pressure reading without diagnosis of hypertension    After discussion, Ms Proffit feels she would be more reassured with ED workup for CP. Limited diagnostic capability here.  I have personally viewed and independently interpreted the imaging studies ordered this visit. CXR: no acute changes. ECG: NSR; no STEMI.  Discharged to ED via POV; stable.  Reviewed expectations re: course of current medical issues. Questions answered. Outlined signs and symptoms indicating need for more acute intervention. Patient verbalized understanding. After Visit Summary given.   SUBJECTIVE:  History from: patient. Candice Huffman is a 42 y.o. female who presents with complaint of intermittent non-radiating lower L CP. Noted today; similar approx 1 year ago with negative ED workup in [place]. Denies assoc n/v/diaphoresis/SOB. Pain lasts a few seconds when present; several episodes while here. Denies any chest trauma or recent strenuous activities. No tx PTA. Denies recent travel and LE swelling.   Social History   Tobacco Use  Smoking Status Never  Smokeless Tobacco Not on file   Denies recreational drug use.  Increased blood pressure noted today. Is not treated for HTN. H/O gestational HTN.  OBJECTIVE:  Vitals:   11/24/22 1229  BP: (!) 170/98  Pulse: 91  Resp: 17  Temp: 98.2 F (36.8 C)  TempSrc: Oral  SpO2: 96%    General appearance: alert, oriented, no acute distress Eyes: PERRLA; EOMI; conjunctivae normal HENT: normocephalic; atraumatic Neck: supple with FROM Lungs: without labored respirations; speaks full sentences without difficulty; CTAB Heart: regular rate and rhythm without murmer Chest Wall: without tenderness to palpation Abdomen: soft, non-tender; no guarding or rebound tenderness Extremities: without edema; without calf  swelling or tenderness; symmetrical without gross deformities Skin: warm and dry; without rash or lesions Neuro: normal gait Psychological: alert and cooperative; normal mood and affect    Imaging: No results found.   Allergies  Allergen Reactions   Lisinopril Cough and Swelling    Past Medical History:  Diagnosis Date   Abnormal Pap smear    Pregnancy induced hypertension    Social History   Socioeconomic History   Marital status: Single    Spouse name: Not on file   Number of children: Not on file   Years of education: Not on file   Highest education level: Not on file  Occupational History   Not on file  Tobacco Use   Smoking status: Never   Smokeless tobacco: Not on file  Substance and Sexual Activity   Alcohol use: No   Drug use: No   Sexual activity: Yes  Other Topics Concern   Not on file  Social History Narrative   Not on file   Social Determinants of Health   Financial Resource Strain: Not on file  Food Insecurity: Not on file  Transportation Needs: Not on file  Physical Activity: Not on file  Stress: Not on file  Social Connections: Unknown (06/21/2021)   [facility] from [facility]   Social Network    Social Network: Not on file  Intimate Partner Violence: Unknown (05/14/2021)   Received from [facility]   HITS    Physically Hurt: Not on file    Insult or Talk Down To: Not on file    Threaten Physical Harm: Not on file    Scream or Curse: Not on file   History reviewed. No pertinent family history. Past  Surgical History:  Procedure Laterality Date   CESAREAN SECTION     TONSILLECTOMY     TUBAL LIGATION  12/16/2010   Procedure: POST PARTUM TUBAL LIGATION;  Surgeon: Hal Morales, MD;  Location: [facility];  Service: Gynecology;  Laterality: Bilateral;

## 2023-01-28 NOTE — Progress Notes (Signed)
[facility] [address] [place] [address] Dept: [phone] Dept Fax: [phone]  New Patient Office Visit  Subjective:   Candice Huffman [birth year] 01/29/2023  Chief Complaint  Patient presents with   Establish Care   Medication Refill   Urinary Frequency    HPI: Candice Huffman presents today to establish care at [facility] Introduced to Publishing rights manager role and practice setting.  All questions answered.  Concerns: See below        HYPERTENSION: Candice Huffman presents for the medical management of hypertension.  Patient's current hypertension medication regimen is: Amlodipine, but has not been taking this for several months. Patient is not currently taking prescribed medications for HTN.  Patient is not regularly keeping a check on BP at home.  No headache, dizziness,  SHOB, vision changes.   BP Readings from Last 3 Encounters:  01/29/23 (!) 148/102  11/24/22 (!) 170/98  07/11/14 142/86   Anemia: Candice Huffman presents for the medical management of anemia.  Current medication : Ferrous Sulfate once daily and occasionally needs iron infusion. Needs hem/onc referral.  Has not been taking iron supplement recently. No bloody stools, hematuria, excessive fatigue, palpitations, pica.  Patient was seeing hematology for management.   Lab Results  Component Value Date   WBC 13.8 (H) 12/16/2010   HGB 9.4 (L) 12/16/2010   HCT 28.5 (L) 12/16/2010   MCV 87.2 12/16/2010   PLT 174 12/16/2010   No results found for: "IRON", "TIBC", "FERRITIN"    VAGINAL DISCHARGE She recently went to the health department on November 22 and was diagnosed with BV.  She was not treated with any antibiotics, as the patient states she was told it was too early to treat.  She did try some boric acid suppositories at home.  Since her evaluation at the health department, she has had  an increase in vaginal discharge and odor. Does report urinary frequency. Also states she has had problems with not being able to fully empty her bladder. She states she does hold her urine for long periods of time. She did do pelvic floor therapy in the past which did help. No dysuria.   LMP: 01/15/23     The following portions of the patient's history were reviewed and updated as appropriate: past medical history, past surgical history, family history, social history, allergies, medications, and problem list.   Patient Active Problem List   Diagnosis Date Noted   Iron deficiency anemia 01/29/2023   Primary hypertension 01/29/2023   Encounter for sterilization 12/16/2010   NSVD (normal spontaneous vaginal delivery) 12/15/2010   VBAC, delivered, current hospitalization 12/15/2010   Meconium in amniotic fluid noted in labor/delivery, liveborn infant 12/15/2010   GBS (group B Streptococcus carrier), +RV culture, currently pregnant 12/14/2010   History of anxiety 12/14/2010   Gestational hypertension 12/08/2010   Previous cesarean section 12/08/2010   Past Medical History:  Diagnosis Date   Abnormal Pap smear    Pregnancy induced hypertension    Past Surgical History:  Procedure Laterality Date   CESAREAN SECTION     TONSILLECTOMY     TUBAL LIGATION  12/16/2010   Procedure: POST PARTUM TUBAL LIGATION;  Surgeon: Hal Morales, MD;  Location: [facility];  Service: Gynecology;  Laterality: Bilateral;   History reviewed. No pertinent family history.  Current Outpatient Medications:    amLODipine (NORVASC) 5 MG tablet, Take 1 tablet (5 mg total) by mouth daily., Disp:  90 tablet, Rfl: 1   fluconazole (DIFLUCAN) 150 MG tablet, Take 1 tablet by mouth once. Repeat dose in 3 days if symptoms persist., Disp: 2 tablet, Rfl: 0   metroNIDAZOLE (FLAGYL) 500 MG tablet, Take 1 tablet (500 mg total) by mouth 2 (two) times daily for 7 days., Disp: 14 tablet, Rfl: 0   ferrous sulfate 325 (65 FE) MG  tablet, Take 1 tablet (325 mg total) by mouth daily with breakfast., Disp: 90 tablet, Rfl: 1 Allergies  Allergen Reactions   Lisinopril Cough and Swelling    ROS: A complete ROS was performed with pertinent positives/negatives noted in the HPI. The remainder of the ROS are negative.   Objective:   Today's Vitals   01/29/23 1004  BP: (!) 148/102  Pulse: 76  Temp: 98.7 F (37.1 C)  TempSrc: Temporal  SpO2: 97%  Weight: 188 lb 6.4 oz (85.5 kg)  Height: 5\' 4"  (1.626 m)    GENERAL: Well-appearing, in NAD. Well nourished.  SKIN: Pink, warm and dry. No rash, lesion, ulceration, or ecchymoses.  NECK: Trachea midline. Full ROM w/o pain or tenderness. No lymphadenopathy.  RESPIRATORY: Chest wall symmetrical. Respirations even and non-labored. Breath sounds clear to auscultation bilaterally.  CARDIAC: S1, S2 present, regular rate and rhythm. Peripheral pulses 2+ bilaterally.  EXTREMITIES: Without clubbing, cyanosis, or edema.  NEUROLOGIC: No motor or sensory deficits. Steady, even gait.  PSYCH/MENTAL STATUS: Alert, oriented x 3. Cooperative, appropriate mood and affect.   Health Maintenance Due  Topic Date Due   Hepatitis C Screening  Never done   Cervical Cancer Screening (HPV/Pap Cotest)  Never done    Results for orders placed or performed in visit on 01/29/23  POCT Urinalysis Dipstick (Automated)  Result Value Ref Range   Color, UA yellow    Clarity, UA clear    Glucose, UA Negative Negative   Bilirubin, UA neg    Ketones, UA neg    Spec Grav, UA 1.020 1.010 - 1.025   Blood, UA Trace    pH, UA 6.0 5.0 - 8.0   Protein, UA Negative Negative   Urobilinogen, UA negative 0.2 or 1.0 E.U./dL   Nitrite, UA neg    Leukocytes, UA Negative Negative    Assessment & Plan:    Primary hypertension -     amLODIPine Besylate; Take 1 tablet (5 mg total) by mouth daily.  Dispense: 90 tablet; Refill: 1 -     TSH  Urinary frequency -     POCT Urinalysis Dipstick (Automated) - trace  blood in urine, will send of for microscopic eval.  -     Urine Microscopic  Iron deficiency anemia, unspecified iron deficiency anemia type -     Ambulatory referral to Hematology / Oncology -     Ferrous Sulfate; Take 1 tablet (325 mg total) by mouth daily with breakfast.  Dispense: 90 tablet; Refill: 1 -     CBC with Differential/Platelet -     Comprehensive metabolic panel -     Iron, TIBC and Ferritin Panel  BV (bacterial vaginosis) -     metroNIDAZOLE; Take 1 tablet (500 mg total) by mouth 2 (two) times daily for 7 days.  Dispense: 14 tablet; Refill: 0 -     Cervicovaginal ancillary only  Other orders -     Fluconazole; Take 1 tablet by mouth once. Repeat dose in 3 days if symptoms persist.  Dispense: 2 tablet; Refill: 0    Orders Placed This Encounter  Procedures  Urine Microscopic Only   CBC with Differential/Platelet   Comp Met (CMET)   TSH   Iron, TIBC and Ferritin Panel   Ambulatory referral to Hematology / Oncology    Referral Priority:   Routine    Referral Type:   Consultation    Referral Reason:   Specialty Services Required    Number of Visits Requested:   1   POCT Urinalysis Dipstick (Automated)   Meds ordered this encounter  Medications   amLODipine (NORVASC) 5 MG tablet    Sig: Take 1 tablet (5 mg total) by mouth daily.    Dispense:  90 tablet    Refill:  1    Supervising Provider:   Garnette Gunner [[id number]]   ferrous sulfate 325 (65 FE) MG tablet    Sig: Take 1 tablet (325 mg total) by mouth daily with breakfast.    Dispense:  90 tablet    Refill:  1    Supervising Provider:   Garnette Gunner [[id number]]   metroNIDAZOLE (FLAGYL) 500 MG tablet    Sig: Take 1 tablet (500 mg total) by mouth 2 (two) times daily for 7 days.    Dispense:  14 tablet    Refill:  0    Supervising Provider:   Garnette Gunner [[id number]]   fluconazole (DIFLUCAN) 150 MG tablet    Sig: Take 1 tablet by mouth once. Repeat dose in 3 days if symptoms persist.    Dispense:  2  tablet    Refill:  0    Supervising Provider:   Garnette Gunner [[id number]]    Return in about 4 months (around 05/30/2023) for Fasting Annual Physical Exam.   Salvatore Decent, FNP

## 2023-01-29 ENCOUNTER — Encounter: Payer: Self-pay | Admitting: Internal Medicine

## 2023-01-29 ENCOUNTER — Encounter: Payer: 59 | Admitting: Internal Medicine

## 2023-01-29 ENCOUNTER — Other Ambulatory Visit (HOSPITAL_COMMUNITY)
Admission: RE | Admit: 2023-01-29 | Discharge: 2023-01-29 | Disposition: A | Discharge status: Home / Self Care | Payer: 59 | Source: Ambulatory Visit | Attending: Internal Medicine | Admitting: Internal Medicine

## 2023-01-29 ENCOUNTER — Ambulatory Visit: Payer: 59 | Admitting: Internal Medicine

## 2023-01-29 VITALS — BP 148/102 | HR 76 | Temp 98.7°F | Ht 64.0 in | Wt 188.4 lb

## 2023-01-29 DIAGNOSIS — D509 Iron deficiency anemia, unspecified: Secondary | ICD-10-CM | POA: Insufficient documentation

## 2023-01-29 DIAGNOSIS — R350 Frequency of micturition: Secondary | ICD-10-CM

## 2023-01-29 DIAGNOSIS — B9689 Other specified bacterial agents as the cause of diseases classified elsewhere: Secondary | ICD-10-CM

## 2023-01-29 DIAGNOSIS — N760 Acute vaginitis: Secondary | ICD-10-CM

## 2023-01-29 DIAGNOSIS — I10 Essential (primary) hypertension: Secondary | ICD-10-CM | POA: Insufficient documentation

## 2023-01-29 LAB — POC URINALSYSI DIPSTICK (AUTOMATED)
Bilirubin, UA: NEGATIVE
Glucose, UA: NEGATIVE
Ketones, UA: NEGATIVE
Leukocytes, UA: NEGATIVE
Nitrite, UA: NEGATIVE
Protein, UA: NEGATIVE
Spec Grav, UA: 1.02 (ref 1.010–1.025)
Urobilinogen, UA: NEGATIVE U/dL
pH, UA: 6 (ref 5.0–8.0)

## 2023-01-29 LAB — TSH: TSH: 1.44 u[IU]/mL (ref 0.35–5.50)

## 2023-01-29 LAB — COMPREHENSIVE METABOLIC PANEL
ALT: 7 U/L (ref 0–35)
AST: 10 U/L (ref 0–37)
Albumin: 4 g/dL (ref 3.5–5.2)
Alkaline Phosphatase: 50 U/L (ref 39–117)
BUN: 9 mg/dL (ref 6–23)
CO2: 26 meq/L (ref 19–32)
Calcium: 8.9 mg/dL (ref 8.4–10.5)
Chloride: 105 meq/L (ref 96–112)
Creatinine, Ser: 0.83 mg/dL (ref 0.40–1.20)
GFR: 88.14 mL/min (ref >60.00)
Glucose, Bld: 88 mg/dL (ref 70–99)
Potassium: 4 meq/L (ref 3.5–5.1)
Sodium: 137 meq/L (ref 135–145)
Total Bilirubin: 0.8 mg/dL (ref 0.2–1.2)
Total Protein: 6.7 g/dL (ref 6.0–8.3)

## 2023-01-29 LAB — CBC WITH DIFFERENTIAL/PLATELET
Basophils Absolute: 0 10*3/uL (ref 0.0–0.1)
Basophils Relative: 0.3 % (ref 0.0–3.0)
Eosinophils Absolute: 0.1 10*3/uL (ref 0.0–0.7)
Eosinophils Relative: 1 % (ref 0.0–5.0)
HCT: 36.7 % (ref 36.0–46.0)
Hemoglobin: 11.8 g/dL — ABNORMAL LOW (ref 12.0–15.0)
Lymphocytes Relative: 29.1 % (ref 12.0–46.0)
Lymphs Abs: 1.9 10*3/uL (ref 0.7–4.0)
MCHC: 32.2 g/dL (ref 30.0–36.0)
MCV: 84.8 fL (ref 78.0–100.0)
Monocytes Absolute: 0.5 10*3/uL (ref 0.1–1.0)
Monocytes Relative: 8.3 % (ref 3.0–12.0)
Neutro Abs: 4 10*3/uL (ref 1.4–7.7)
Neutrophils Relative %: 61.3 % (ref 43.0–77.0)
Platelets: 271 10*3/uL (ref 150.0–400.0)
RBC: 4.33 Mil/uL (ref 3.87–5.11)
RDW: 13.4 % (ref 11.5–15.5)
WBC: 6.6 10*3/uL (ref 4.0–10.5)

## 2023-01-29 LAB — URINALYSIS, MICROSCOPIC ONLY

## 2023-01-29 MED ORDER — FERROUS SULFATE 325 (65 FE) MG PO TABS: 325.0000 mg | ORAL_TABLET | Freq: Every day | ORAL | 1 refills | Status: AC

## 2023-01-29 MED ORDER — FLUCONAZOLE 150 MG PO TABS: ORAL_TABLET | ORAL | 0 refills | Status: AC

## 2023-01-29 MED ORDER — AMLODIPINE BESYLATE 5 MG PO TABS: 5.0000 mg | ORAL_TABLET | Freq: Every day | ORAL | 1 refills | Status: DC

## 2023-01-29 MED ORDER — METRONIDAZOLE 500 MG PO TABS: 500.0000 mg | ORAL_TABLET | Freq: Two times a day (BID) | ORAL | 0 refills | Status: AC

## 2023-02-02 LAB — CERVICOVAGINAL ANCILLARY ONLY
Bacterial Vaginitis (gardnerella): POSITIVE — AB
Candida Glabrata: NEGATIVE
Candida Vaginitis: NEGATIVE
Chlamydia: NEGATIVE
Comment: NEGATIVE
Comment: NEGATIVE
Comment: NEGATIVE
Comment: NEGATIVE
Comment: NEGATIVE
Comment: NORMAL
Neisseria Gonorrhea: NEGATIVE
Trichomonas: NEGATIVE

## 2023-03-10 ENCOUNTER — Other Ambulatory Visit: Payer: Self-pay | Admitting: Family

## 2023-03-10 DIAGNOSIS — D509 Iron deficiency anemia, unspecified: Secondary | ICD-10-CM

## 2023-03-12 ENCOUNTER — Encounter: Payer: Self-pay | Admitting: Family

## 2023-03-12 ENCOUNTER — Inpatient Hospital Stay: Payer: 59 | Admitting: Family

## 2023-03-12 ENCOUNTER — Inpatient Hospital Stay: Payer: 59 | Attending: Hematology & Oncology

## 2023-03-12 VITALS — HR 82 | Temp 98.5°F | Resp 17 | Ht 64.0 in | Wt 185.0 lb

## 2023-03-12 DIAGNOSIS — Z808 Family history of malignant neoplasm of other organs or systems: Secondary | ICD-10-CM | POA: Insufficient documentation

## 2023-03-12 DIAGNOSIS — Z800 Family history of malignant neoplasm of digestive organs: Secondary | ICD-10-CM | POA: Diagnosis not present

## 2023-03-12 DIAGNOSIS — Z79899 Other long term (current) drug therapy: Secondary | ICD-10-CM | POA: Diagnosis not present

## 2023-03-12 DIAGNOSIS — Z803 Family history of malignant neoplasm of breast: Secondary | ICD-10-CM

## 2023-03-12 DIAGNOSIS — D509 Iron deficiency anemia, unspecified: Secondary | ICD-10-CM | POA: Diagnosis present

## 2023-03-12 LAB — IRON AND IRON BINDING CAPACITY (CC-WL,HP ONLY)
Iron: 58 ug/dL (ref 28–170)
Saturation Ratios: 22 % (ref 10.4–31.8)
TIBC: 270 ug/dL (ref 250–450)
UIBC: 212 ug/dL (ref 148–442)

## 2023-03-12 LAB — CBC WITH DIFFERENTIAL (CANCER CENTER ONLY)
Abs Immature Granulocytes: 0.03 10*3/uL (ref 0.00–0.07)
Basophils Absolute: 0 10*3/uL (ref 0.0–0.1)
Basophils Relative: 1 %
Eosinophils Absolute: 0.1 10*3/uL (ref 0.0–0.5)
Eosinophils Relative: 1 %
HCT: 37.3 % (ref 36.0–46.0)
Hemoglobin: 11.8 g/dL — ABNORMAL LOW (ref 12.0–15.0)
Immature Granulocytes: 1 %
Lymphocytes Relative: 29 %
Lymphs Abs: 1.8 10*3/uL (ref 0.7–4.0)
MCH: 27.3 pg (ref 26.0–34.0)
MCHC: 31.6 g/dL (ref 30.0–36.0)
MCV: 86.1 fL (ref 80.0–100.0)
Monocytes Absolute: 0.6 10*3/uL (ref 0.1–1.0)
Monocytes Relative: 9 %
Neutro Abs: 3.7 10*3/uL (ref 1.7–7.7)
Neutrophils Relative %: 59 %
Platelet Count: 317 10*3/uL (ref 150–400)
RBC: 4.33 MIL/uL (ref 3.87–5.11)
RDW: 12.9 % (ref 11.5–15.5)
WBC Count: 6.2 10*3/uL (ref 4.0–10.5)
nRBC: 0 % (ref 0.0–0.2)

## 2023-03-12 LAB — FERRITIN: Ferritin: 25 ng/mL (ref 11–307)

## 2023-03-12 LAB — RETICULOCYTES
Immature Retic Fract: 8.4 % (ref 2.3–15.9)
RBC.: 4.26 MIL/uL (ref 3.87–5.11)
Retic Count, Absolute: 60.5 10*3/uL (ref 19.0–186.0)
Retic Ct Pct: 1.4 % (ref 0.4–3.1)

## 2023-03-12 NOTE — Progress Notes (Signed)
Hematology/Oncology Consultation   Name: Candice Huffman      MRN: [id number]    Location: Room/bed info not found  Date: 03/12/2023 Time:9:00 AM   REFERRING PHYSICIAN:  Salvatore Decent, FNP  REASON FOR CONSULT: Iron deficiency anemia    DIAGNOSIS: Iron deficiency anemia   HISTORY OF PRESENT ILLNESS: Candice Huffman is a very pleasant 43 yo African American female with long history of IDA. She was originally diagnosed as a teenager.   She has received IV iron in the past. She states that her last dose was in 2023 and she has tolerated nicely.  She has been taking oral iron but this has not been effective.  She is symptomatic with fatigue, lips swell and itch, bruising easily, thirst, SOB, chest discomfort (states cardiac work up has been negative), palpitations, dizziness with presyncope.  No syncope or falls to report.  Appetite is down but she is still making herself eat. Hydration is good. Weight stable at 185 lbs.  Her cycle is regular with heavy flow. She states that she has noted less clots.  No other blood loss noted. No petechiae or excessive bruising.  No known familial history of anemia.  Daughter has the sickle cell trait. Patient has been tested and will send her results on mychart.  She has 2 children ages 42 and 75. History of 1 miscarriage in the first 8 weeks.  Mammogram is up to date per patient and results were negative.  She states that she has had both an EGD and colonoscopy which only revealed Barrett's esophagus treated with two weeks of PPI.  Past surgical history includes tonsillectomy, tooth extraction, C-section and tubal ligation without complications.  No history of diabetes or thyroid disease.  No personal history of cancer. Familial history only on paternal side includes grandmother with breast, aunt with metastatic breast, uncles with pancreatic and uncle with testicular.  No fever, chills, n/v, cough, rash, abdominal pain or changes in bowel or bladder habits at  this time.  No swelling, numbness or tingling in her extremities.  No smoking, ETOH or recreational drug use.  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   ROS: All other 10 point review of systems is negative.   PAST MEDICAL HISTORY:   Past Medical History:  Diagnosis Date   Abnormal Pap smear    Pregnancy induced hypertension     ALLERGIES: Allergies  Allergen Reactions   Lisinopril Cough and Swelling      MEDICATIONS:  Current Outpatient Medications on File Prior to Visit  Medication Sig Dispense Refill   amLODipine (NORVASC) 5 MG tablet Take 1 tablet (5 mg total) by mouth daily. 90 tablet 1   ferrous sulfate 325 (65 FE) MG tablet Take 1 tablet (325 mg total) by mouth daily with breakfast. 90 tablet 1   fluconazole (DIFLUCAN) 150 MG tablet Take 1 tablet by mouth once. Repeat dose in 3 days if symptoms persist. 2 tablet 0   No current facility-administered medications on file prior to visit.     PAST SURGICAL HISTORY Past Surgical History:  Procedure Laterality Date   CESAREAN SECTION     TONSILLECTOMY     TUBAL LIGATION  12/16/2010   Procedure: POST PARTUM TUBAL LIGATION;  Surgeon: Hal Morales, MD;  Location: [facility];  Service: Gynecology;  Laterality: Bilateral;    FAMILY HISTORY: No family history on file.  SOCIAL HISTORY:  reports that she has never smoked. She does not have any smokeless tobacco history on file. She reports that she does not drink alcohol and does not use drugs.  PERFORMANCE STATUS: The patient's performance status is 1 - Symptomatic but completely ambulatory  PHYSICAL EXAM: Most Recent Vital Signs: There were no vitals taken for this visit. Pulse 82   Temp 98.5 F (36.9 C) (Oral)   Resp 17   Ht 5\' 4"  (1.626 m)   Wt 185 lb (83.9 kg)   SpO2 100%   BMI 31.76 kg/m   General Appearance:    Alert, cooperative, no distress, appears  stated age  Head:    Normocephalic, without obvious abnormality, atraumatic  Eyes:    PERRL, conjunctiva/corneas clear, EOM's intact, fundi    benign, both eyes        Throat:   Lips, mucosa, and tongue normal; teeth and gums normal  Neck:   Supple, symmetrical, trachea midline, no adenopathy;    thyroid:  no enlargement/tenderness/nodules; no carotid   bruit or JVD  Back:     Symmetric, no curvature, ROM normal, no CVA tenderness  Lungs:     Clear to auscultation bilaterally, respirations unlabored  Chest Wall:    No tenderness or deformity   Heart:    Regular rate and rhythm, S1 and S2 normal, no murmur, rub   or gallop     Abdomen:     Soft, non-tender, bowel sounds active all four quadrants,    no masses, no organomegaly        Extremities:   Extremities normal, atraumatic, no cyanosis or edema  Pulses:   2+ and symmetric all extremities  Skin:   Skin color, texture, turgor normal, no rashes or lesions  Lymph nodes:   Cervical, supraclavicular, and axillary nodes normal  Neurologic:   CNII-XII intact, normal strength, sensation and reflexes    throughout    LABORATORY DATA:  No results found for this or any previous visit (from the past 48 hours).    RADIOGRAPHY: No results found.     PATHOLOGY: None  ASSESSMENT/PLAN: Candice Huffman is a very pleasant 43 yo [place] American female with long history of IDA.  We will get her set up for 3 doses of IV iron.  Follow-up in 8 weeks.   All questions were answered. The patient knows to call the clinic with any problems, questions or concerns. We can certainly see the patient much sooner if necessary.   [name], NP

## 2023-03-25 ENCOUNTER — Inpatient Hospital Stay: Payer: 59 | Attending: Hematology & Oncology

## 2023-03-25 VITALS — BP 155/85 | HR 79 | Temp 98.4°F | Resp 18

## 2023-03-25 DIAGNOSIS — D509 Iron deficiency anemia, unspecified: Secondary | ICD-10-CM | POA: Diagnosis present

## 2023-03-25 DIAGNOSIS — D500 Iron deficiency anemia secondary to blood loss (chronic): Secondary | ICD-10-CM

## 2023-03-25 MED ORDER — IRON SUCROSE 20 MG/ML IV SOLN
300.0000 mg | Freq: Once | INTRAVENOUS | Status: AC
  Administered 2023-03-25: 300 mg via INTRAVENOUS
  Filled 2023-03-25: qty 300

## 2023-03-25 MED ORDER — SODIUM CHLORIDE 0.9 % IV SOLN
INTRAVENOUS | Status: DC
Start: 2023-03-25 — End: 2023-03-25

## 2023-03-25 NOTE — Patient Instructions (Signed)

## 2023-03-25 NOTE — Progress Notes (Signed)
Venofer 300mg  diluted in NS251mL, Lot:[id number], Exp: 06/08/2024.  Anola Gurney Erick, [place], BCPS, BCOP 03/25/2023 12:51 PM

## 2023-04-01 ENCOUNTER — Inpatient Hospital Stay: Payer: 59

## 2023-04-01 VITALS — BP 142/95 | HR 73 | Temp 97.9°F | Resp 20

## 2023-04-01 DIAGNOSIS — D509 Iron deficiency anemia, unspecified: Secondary | ICD-10-CM | POA: Diagnosis not present

## 2023-04-01 DIAGNOSIS — D500 Iron deficiency anemia secondary to blood loss (chronic): Secondary | ICD-10-CM

## 2023-04-01 MED ORDER — SODIUM CHLORIDE 0.9 % IV SOLN
300.0000 mg | Freq: Once | INTRAVENOUS | Status: AC
  Administered 2023-04-01: 300 mg via INTRAVENOUS
  Filled 2023-04-01: qty 300

## 2023-04-01 MED ORDER — SODIUM CHLORIDE 0.9 % IV SOLN: INTRAVENOUS | Status: DC

## 2023-04-01 NOTE — Patient Instructions (Signed)

## 2023-04-08 ENCOUNTER — Inpatient Hospital Stay: Payer: 59

## 2023-04-08 VITALS — BP 131/73 | HR 70 | Temp 98.1°F | Resp 18

## 2023-04-08 DIAGNOSIS — D509 Iron deficiency anemia, unspecified: Secondary | ICD-10-CM | POA: Diagnosis not present

## 2023-04-08 DIAGNOSIS — D500 Iron deficiency anemia secondary to blood loss (chronic): Secondary | ICD-10-CM

## 2023-04-08 MED ORDER — SODIUM CHLORIDE 0.9 % IV SOLN: INTRAVENOUS | Status: DC

## 2023-04-08 MED ORDER — SODIUM CHLORIDE 0.9 % IV SOLN
300.0000 mg | Freq: Once | INTRAVENOUS | Status: AC
  Administered 2023-04-08: 300 mg via INTRAVENOUS
  Filled 2023-04-08: qty 300

## 2023-04-08 NOTE — Progress Notes (Signed)
 Patient did not want to stay for the recommended 30 minute post IV iron observation. Patient discharged ambulatory without complaints or concerns. VSS.

## 2023-05-11 ENCOUNTER — Inpatient Hospital Stay: Payer: 59 | Attending: Hematology & Oncology

## 2023-05-11 ENCOUNTER — Encounter: Payer: Self-pay | Admitting: Family

## 2023-05-11 ENCOUNTER — Inpatient Hospital Stay: Payer: 59 | Admitting: Family

## 2023-05-11 VITALS — BP 135/78 | HR 87 | Temp 99.0°F | Resp 17 | Wt 188.0 lb

## 2023-05-11 DIAGNOSIS — D500 Iron deficiency anemia secondary to blood loss (chronic): Secondary | ICD-10-CM | POA: Diagnosis not present

## 2023-05-11 DIAGNOSIS — D509 Iron deficiency anemia, unspecified: Secondary | ICD-10-CM

## 2023-05-11 LAB — CBC WITH DIFFERENTIAL (CANCER CENTER ONLY)
Abs Immature Granulocytes: 0.07 10*3/uL (ref 0.00–0.07)
Basophils Absolute: 0 10*3/uL (ref 0.0–0.1)
Basophils Relative: 0 %
Eosinophils Absolute: 0 10*3/uL (ref 0.0–0.5)
Eosinophils Relative: 0 %
HCT: 38.4 % (ref 36.0–46.0)
Hemoglobin: 12.5 g/dL (ref 12.0–15.0)
Immature Granulocytes: 1 %
Lymphocytes Relative: 28 %
Lymphs Abs: 2 10*3/uL (ref 0.7–4.0)
MCH: 28.5 pg (ref 26.0–34.0)
MCHC: 32.6 g/dL (ref 30.0–36.0)
MCV: 87.7 fL (ref 80.0–100.0)
Monocytes Absolute: 0.4 10*3/uL (ref 0.1–1.0)
Monocytes Relative: 6 %
Neutro Abs: 4.6 10*3/uL (ref 1.7–7.7)
Neutrophils Relative %: 65 %
Platelet Count: 279 10*3/uL (ref 150–400)
RBC: 4.38 MIL/uL (ref 3.87–5.11)
RDW: 13.2 % (ref 11.5–15.5)
WBC Count: 7.2 10*3/uL (ref 4.0–10.5)
nRBC: 0 % (ref 0.0–0.2)

## 2023-05-11 LAB — RETICULOCYTES
Immature Retic Fract: 9.8 % (ref 2.3–15.9)
RBC.: 4.43 MIL/uL (ref 3.87–5.11)
Retic Count, Absolute: 66.5 10*3/uL (ref 19.0–186.0)
Retic Ct Pct: 1.5 % (ref 0.4–3.1)

## 2023-05-11 LAB — FERRITIN: Ferritin: 150 ng/mL (ref 11–307)

## 2023-05-11 NOTE — Progress Notes (Signed)
 Hematology and Oncology Follow Up Visit  Candice Huffman [id number] 02/21/1980 42 y.o. 05/11/2023   Principle Diagnosis:  Iron deficiency anemia   Current Therapy:   IV iron as indicated    Interim History:  Candice Huffman is here today for follow-up. She is doing fairly well. She notes that her energy had improved but she does feel a little more fatigued lately.  Her cycle is regular with the same heavy flow. No other blood loss noted.  No abnormal bruising, no petechiae.  No fever, chills, n/v, cough, rash, dizziness, SOB, chest pain, palpitations, abdominal pain or changes in bowel or bladder habits.  No swelling, tenderness, numbness or tingling in her extremities.  No falls or syncope.  Appetite and hydration are good. Weight is stable at 188 lbs.   ECOG Performance Status: 1 - Symptomatic but completely ambulatory  Medications:  Allergies as of 05/11/2023       Reactions   Lisinopril Cough, Swelling        Medication List        Accurate as of May 11, 2023  2:11 PM. If you have any questions, ask your nurse or doctor.          amLODipine 5 MG tablet Commonly known as: NORVASC Take 1 tablet (5 mg total) by mouth daily.   ferrous sulfate 325 (65 FE) MG tablet Take 1 tablet (325 mg total) by mouth daily with breakfast.   fluconazole 150 MG tablet Commonly known as: Diflucan Take 1 tablet by mouth once. Repeat dose in 3 days if symptoms persist.        Allergies:  Allergies  Allergen Reactions   Lisinopril Cough and Swelling    Past Medical History, Surgical history, Social history, and Family History were reviewed and updated.  Review of Systems: All other 10 point review of systems is negative.   Physical Exam:  weight is 188 lb (85.3 kg). Her oral temperature is 99 F (37.2 C). Her blood pressure is 135/78 and her pulse is 87. Her respiration is 17 and oxygen saturation is 100%.   Wt Readings from Last 3 Encounters:  05/11/23 188 lb (85.3  kg)  03/12/23 185 lb (83.9 kg)  01/29/23 188 lb 6.4 oz (85.5 kg)    Ocular: Sclerae unicteric, pupils equal, round and reactive to light Ear-nose-throat: Oropharynx clear, dentition fair Lymphatic: No cervical or supraclavicular adenopathy Lungs no rales or rhonchi, good excursion bilaterally Heart regular rate and rhythm, no murmur appreciated Abd soft, nontender, positive bowel sounds MSK no focal spinal tenderness, no joint edema Neuro: non-focal, well-oriented, appropriate affect Breasts: Deferred  Lab Results  Component Value Date   WBC 7.2 05/11/2023   HGB 12.5 05/11/2023   HCT 38.4 05/11/2023   MCV 87.7 05/11/2023   PLT 279 05/11/2023   Lab Results  Component Value Date   FERRITIN 25 03/12/2023   IRON 58 03/12/2023   TIBC 270 03/12/2023   UIBC 212 03/12/2023   IRONPCTSAT 22 03/12/2023   Lab Results  Component Value Date   RETICCTPCT 1.5 05/11/2023   RBC 4.38 05/11/2023   RBC 4.43 05/11/2023   No results found for: "KPAFRELGTCHN", "LAMBDASER", "KAPLAMBRATIO" No results found for: "IGGSERUM", "IGA", "IGMSERUM" No results found for: "TOTALPROTELP", "ALBUMINELP", "A1GS", "A2GS", "BETS", "BETA2SER", "GAMS", "MSPIKE", "SPEI"   Chemistry      Component Value Date/Time   NA 137 01/29/2023 1049   K 4.0 01/29/2023 1049   CL 105 01/29/2023 1049   CO2 26 01/29/2023 1049  BUN 9 01/29/2023 1049   CREATININE 0.83 01/29/2023 1049      Component Value Date/Time   CALCIUM 8.9 01/29/2023 1049   ALKPHOS 50 01/29/2023 1049   AST 10 01/29/2023 1049   ALT 7 01/29/2023 1049   BILITOT 0.8 01/29/2023 1049       Impression and Plan: Candice Huffman is a very pleasant 43 yo African American female with long history of IDA.  She tolerated IV iron nicely! Iron studies are pending.  Follow-up in 3 months.   [name], NP 4/1/20252:11 PM

## 2023-05-12 LAB — IRON AND IRON BINDING CAPACITY (CC-WL,HP ONLY)
Iron: 76 ug/dL (ref 28–170)
Saturation Ratios: 31 % (ref 10.4–31.8)
TIBC: 248 ug/dL — ABNORMAL LOW (ref 250–450)
UIBC: 172 ug/dL (ref 148–442)

## 2023-06-03 NOTE — Progress Notes (Unsigned)
Subjective:   Candice Huffman [birth year]  06/04/2023   CC: No chief complaint on file.   HPI: Candice Huffman is a 43 y.o. female who presents for a routine health maintenance exam.  Labs collected at time of visit.    HEALTH SCREENINGS: - Pap smear: {Blank single:19197::"pap done","not applicable","up to date","done elsewhere"} - Mammogram (40+): {Blank single:19197::"Up to date","Ordered today","Not applicable","Refused","Done elsewhere"}  - Colonoscopy (45+): Not applicable  - Bone Density (65+): Not applicable  - Lung CA screening with low-dose CT:  Not applicable Adults age 61-80 who are current cigarette smokers or quit within the last 15 years. Must have 20 pack year history.   Depression and Anxiety Screen done today and results listed below:     01/29/2023   10:08 AM  Depression screen PHQ 2/9  Decreased Interest 0  Down, Depressed, Hopeless 0  PHQ - 2 Score 0  Altered sleeping 2  Tired, decreased energy 3  Change in appetite 3  Feeling bad or failure about yourself  0  Trouble concentrating 0  Moving slowly or fidgety/restless 0  Suicidal thoughts 0  PHQ-9 Score 8  Difficult doing work/chores Somewhat difficult      01/29/2023   10:10 AM  GAD 7 : Generalized Anxiety Score  Nervous, Anxious, on Edge 1  Control/stop worrying 1  Worry too much - different things 1  Trouble relaxing 1  Restless 1  Easily annoyed or irritable 1  Afraid - awful might happen 0  Total GAD 7 Score 6  Anxiety Difficulty Somewhat difficult    IMMUNIZATIONS: - Tdap: Tetanus vaccination status reviewed: last tetanus booster within 10 years.   Past medical history, surgical history, medications, allergies, family history and social history reviewed with patient today and changes made to appropriate areas of the chart.   Past Medical History:  Diagnosis Date   Abnormal Pap smear    Pregnancy induced hypertension     Past Surgical History:  Procedure Laterality  Date   CESAREAN SECTION     TONSILLECTOMY     TUBAL LIGATION  12/16/2010   Procedure: POST PARTUM TUBAL LIGATION;  Surgeon: Stevenson Elbe, MD;  Location: [facility];  Service: Gynecology;  Laterality: Bilateral;    Current Outpatient Medications on File Prior to Visit  Medication Sig   amLODipine  (NORVASC ) 5 MG tablet Take 1 tablet (5 mg total) by mouth daily. (Patient not taking: Reported on 05/11/2023)   ferrous sulfate  325 (65 FE) MG tablet Take 1 tablet (325 mg total) by mouth daily with breakfast.   fluconazole  (DIFLUCAN ) 150 MG tablet Take 1 tablet by mouth once. Repeat dose in 3 days if symptoms persist. (Patient not taking: Reported on 05/11/2023)   No current facility-administered medications on file prior to visit.    Allergies  Allergen Reactions   Lisinopril Cough and Swelling     Social History   Socioeconomic History   Marital status: Single    Spouse name: Not on file   Number of children: Not on file   Years of education: Not on file   Highest education level: Not on file  Occupational History   Not on file  Tobacco Use   Smoking status: Never   Smokeless tobacco: Not on file  Substance and Sexual Activity   Alcohol use: No   Drug use: No   Sexual activity: Yes  Other Topics Concern   Not on file  Social History Narrative   Not on file   Social  Drivers of Health   Financial Resource Strain: Patient Declined (03/12/2023)   Overall Financial Resource Strain (CARDIA)    Difficulty of Paying Living Expenses: Patient declined  Food Insecurity: Patient Declined (03/12/2023)   Hunger Vital Sign    Worried About Running Out of Food in the Last Year: Patient declined    Ran Out of Food in the Last Year: Patient declined  Transportation Needs: Patient Declined (03/12/2023)   PRAPARE - Administrator, Civil Service (Medical): Patient declined    Lack of Transportation (Non-Medical): Patient declined  Physical Activity: Patient Declined (03/12/2023)    Exercise Vital Sign    Days of Exercise per Week: Patient declined    Minutes of Exercise per Session: Patient declined  Stress: Patient Declined (03/12/2023)   [employer] of Occupational Health - Occupational Stress Questionnaire    Feeling of Stress : Patient declined  Social Connections: Patient Declined (03/12/2023)   Social Connection and Isolation Panel [NHANES]    Frequency of Communication with Friends and Family: Patient declined    Frequency of Social Gatherings with Friends and Family: Patient declined    Attends Religious Services: Patient declined    Database administrator or Organizations: Patient declined    Attends Banker Meetings: Patient declined    Marital Status: Patient declined  Intimate Partner Violence: Patient Declined (03/12/2023)   Humiliation, Afraid, Rape, and Kick questionnaire    Fear of Current or Ex-Partner: Patient declined    Emotionally Abused: Patient declined    Physically Abused: Patient declined    Sexually Abused: Patient declined   Social History   Tobacco Use  Smoking Status Never  Smokeless Tobacco Not on file   Social History   Substance and Sexual Activity  Alcohol Use No    No family history on file.   ROS: Denies fever, fatigue, unexplained weight loss/gain, hearing or vision changes, cardiac or respiratory complaints. Denies neurological deficits, musculoskeletal complaints, gastrointestinal or genitourinary complaints, mental health complaints, and skin changes.   Objective:   There were no vitals filed for this visit.  GENERAL APPEARANCE: Well-appearing, in NAD. Well nourished.  SKIN: Pink, warm and dry. Turgor normal. No rash, lesion, ulceration, or ecchymoses. Hair evenly distributed.  HEENT: HEAD: Normocephalic.  EYES: PERRLA. EOMI. Lids intact w/o defect. Sclera white, Conjunctiva pink w/o exudate.  EARS: External ear w/o redness, swelling, masses or lesions. EAC clear. TM's intact, translucent w/o  bulging, appropriate landmarks visualized. Appropriate acuity to conversational tones.  NOSE: Septum midline w/o deformity. Nares patent, mucosa pink and non-inflamed w/o drainage. No sinus tenderness.  THROAT: Uvula midline. Oropharynx clear. Tonsils non-inflamed w/o exudate ***. Oral mucosa pink and moist.  NECK: Supple, Trachea midline. Full ROM w/o pain or tenderness. No lymphadenopathy. Thyroid  non-tender w/o enlargement or palpable masses.  BREASTS: Breasts pendulous, symmetrical, and w/o palpable masses. Nipples everted and w/o discharge. No rash or skin retraction. No axillary or supraclavicular lymphadenopathy.  RESPIRATORY: Chest wall symmetrical w/o masses. Respirations even and non-labored. Breath sounds clear to auscultation bilaterally. No wheezes, rales, rhonchi, or crackles. CARDIAC: S1, S2 present, regular rate and rhythm. No gallops, murmurs, rubs, or clicks. No carotid bruits. Capillary refill <2 seconds. Peripheral pulses 2+ bilaterally. GI: Abdomen soft w/o distention. Normoactive bowel sounds. No palpable masses or tenderness. No guarding or rebound tenderness. Liver and spleen w/o tenderness or enlargement. No CVA tenderness.  GU: *** External genitalia without erythema, lesions, or masses. No lymphadenopathy. Vaginal mucosa pink and moist without exudate,  lesions, or ulcerations. Cervix pink without discharge. Cervical os closed. Uterus and adnexae palpable, not enlarged, and w/o tenderness. No palpable masses.  MSK: Muscle tone and strength appropriate for age, w/o atrophy or abnormal movement.  EXTREMITIES: Active ROM intact, w/o tenderness, crepitus, or contracture. No obvious joint deformities or effusions. No clubbing, edema, or cyanosis.  NEUROLOGIC: CN's II-XII intact. Motor strength symmetrical with no obvious weakness. No sensory deficits. DTR's 2+ symmetric bilaterally. Steady, even gait.  PSYCH/MENTAL STATUS: Alert, oriented x 3. Cooperative, appropriate mood and  affect.   Chaperoned by Vallorie Gayer CMA ***  Results for orders placed or performed in visit on 05/11/23  CBC with Differential (Cancer Center Only)   Collection Time: 05/11/23  1:29 PM  Result Value Ref Range   WBC Count 7.2 4.0 - 10.5 K/uL   RBC 4.38 3.87 - 5.11 MIL/uL   Hemoglobin 12.5 12.0 - 15.0 g/dL   HCT 40.9 81.1 - 91.4 %   MCV 87.7 80.0 - 100.0 fL   MCH 28.5 26.0 - 34.0 pg   MCHC 32.6 30.0 - 36.0 g/dL   RDW 78.2 95.6 - 21.3 %   Platelet Count 279 150 - 400 K/uL   nRBC 0.0 0.0 - 0.2 %   Neutrophils Relative % 65 %   Neutro Abs 4.6 1.7 - 7.7 K/uL   Lymphocytes Relative 28 %   Lymphs Abs 2.0 0.7 - 4.0 K/uL   Monocytes Relative 6 %   Monocytes Absolute 0.4 0.1 - 1.0 K/uL   Eosinophils Relative 0 %   Eosinophils Absolute 0.0 0.0 - 0.5 K/uL   Basophils Relative 0 %   Basophils Absolute 0.0 0.0 - 0.1 K/uL   Immature Granulocytes 1 %   Abs Immature Granulocytes 0.07 0.00 - 0.07 K/uL  Ferritin   Collection Time: 05/11/23  1:29 PM  Result Value Ref Range   Ferritin 150 11 - 307 ng/mL  Iron  and Iron  Binding Capacity ([facility] only)   Collection Time: 05/11/23  1:29 PM  Result Value Ref Range   Iron  76 28 - 170 ug/dL   TIBC 086 (L) 578 - 469 ug/dL   Saturation Ratios 31 10.4 - 31.8 %   UIBC 172 148 - 442 ug/dL  Reticulocytes   Collection Time: 05/11/23  1:29 PM  Result Value Ref Range   Retic Ct Pct 1.5 0.4 - 3.1 %   RBC. 4.43 3.87 - 5.11 MIL/uL   Retic Count, Absolute 66.5 19.0 - 186.0 K/uL   Immature Retic Fract 9.8 2.3 - 15.9 %    Assessment & Plan:  There are no diagnoses linked to this encounter.  No orders of the defined types were placed in this encounter.   PATIENT COUNSELING:  - Encouraged a healthy well-balanced diet. Patient may adjust caloric intake to maintain or achieve ideal body weight. May reduce intake of dietary saturated fat and total fat and have adequate dietary potassium and calcium preferably from fresh fruits, vegetables, and low-fat  dairy products.   - Advised to avoid cigarette smoking. - Discussed with the patient that most people either abstain from alcohol or drink within safe limits (<=14/week and <=4 drinks/occasion for males, <=7/weeks and <= 3 drinks/occasion for females) and that the risk for alcohol disorders and other health effects rises proportionally with the number of drinks per week and how often a drinker exceeds daily limits. - Discussed cessation/primary prevention of drug use and availability of treatment for abuse.  - Discussed sexually transmitted diseases, avoidance of  unintended pregnancy and contraceptive alternatives.  - Stressed the importance of regular exercise - Injury prevention: Discussed safety belts, safety helmets, smoke detector, smoking near bedding or upholstery.  - Dental health: Discussed importance of regular tooth brushing, flossing, and dental visits.   NEXT PREVENTATIVE PHYSICAL DUE IN 1 YEAR.  No follow-ups on file.  Gavin Kast, FNP

## 2023-06-04 ENCOUNTER — Encounter: Payer: Self-pay | Admitting: Internal Medicine

## 2023-06-04 ENCOUNTER — Ambulatory Visit (INDEPENDENT_AMBULATORY_CARE_PROVIDER_SITE_OTHER): Payer: 59 | Admitting: Internal Medicine

## 2023-06-04 ENCOUNTER — Other Ambulatory Visit (HOSPITAL_COMMUNITY)
Admission: RE | Admit: 2023-06-04 | Discharge: 2023-06-04 | Disposition: A | Discharge status: Home / Self Care | Source: Ambulatory Visit | Attending: Internal Medicine | Admitting: Internal Medicine

## 2023-06-04 VITALS — BP 136/88 | HR 72 | Temp 98.4°F | Ht 65.0 in | Wt 183.2 lb

## 2023-06-04 DIAGNOSIS — Z0000 Encounter for general adult medical examination without abnormal findings: Secondary | ICD-10-CM | POA: Diagnosis not present

## 2023-06-04 DIAGNOSIS — Z1159 Encounter for screening for other viral diseases: Secondary | ICD-10-CM

## 2023-06-04 DIAGNOSIS — Z124 Encounter for screening for malignant neoplasm of cervix: Secondary | ICD-10-CM | POA: Diagnosis present

## 2023-06-04 DIAGNOSIS — M25561 Pain in right knee: Secondary | ICD-10-CM

## 2023-06-04 DIAGNOSIS — M25562 Pain in left knee: Secondary | ICD-10-CM | POA: Diagnosis not present

## 2023-06-04 DIAGNOSIS — G8929 Other chronic pain: Secondary | ICD-10-CM | POA: Diagnosis not present

## 2023-06-04 DIAGNOSIS — R0609 Other forms of dyspnea: Secondary | ICD-10-CM

## 2023-06-04 DIAGNOSIS — Z114 Encounter for screening for human immunodeficiency virus [HIV]: Secondary | ICD-10-CM

## 2023-06-04 DIAGNOSIS — Z1231 Encounter for screening mammogram for malignant neoplasm of breast: Secondary | ICD-10-CM

## 2023-06-04 LAB — COMPREHENSIVE METABOLIC PANEL WITH GFR
ALT: 8 U/L (ref 0–35)
AST: 9 U/L (ref 0–37)
Albumin: 4.4 g/dL (ref 3.5–5.2)
Alkaline Phosphatase: 56 U/L (ref 39–117)
BUN: 11 mg/dL (ref 6–23)
CO2: 30 meq/L (ref 19–32)
Calcium: 9.4 mg/dL (ref 8.4–10.5)
Chloride: 104 meq/L (ref 96–112)
Creatinine, Ser: 0.85 mg/dL (ref 0.40–1.20)
GFR: 85.45 mL/min (ref >60.00)
Glucose, Bld: 99 mg/dL (ref 70–99)
Potassium: 4.4 meq/L (ref 3.5–5.1)
Sodium: 140 meq/L (ref 135–145)
Total Bilirubin: 0.8 mg/dL (ref 0.2–1.2)
Total Protein: 7.1 g/dL (ref 6.0–8.3)

## 2023-06-04 LAB — TSH: TSH: 0.98 u[IU]/mL (ref 0.35–5.50)

## 2023-06-04 LAB — LIPID PANEL
Cholesterol: 196 mg/dL (ref 0–200)
HDL: 64 mg/dL (ref >39.00)
LDL Cholesterol: 119 mg/dL — ABNORMAL HIGH (ref 0–99)
NonHDL: 131.51
Total CHOL/HDL Ratio: 3
Triglycerides: 63 mg/dL (ref 0.0–149.0)
VLDL: 12.6 mg/dL (ref 0.0–40.0)

## 2023-06-05 LAB — HIV ANTIBODY (ROUTINE TESTING W REFLEX): HIV 1&2 Ab, 4th Generation: NONREACTIVE

## 2023-06-05 LAB — HEPATITIS C ANTIBODY: Hepatitis C Ab: NONREACTIVE

## 2023-06-05 LAB — D-DIMER, QUANTITATIVE: D-Dimer, Quant: 1.16 ug{FEU}/mL — ABNORMAL HIGH (ref <0.50)

## 2023-06-07 ENCOUNTER — Other Ambulatory Visit: Payer: Self-pay | Admitting: Internal Medicine

## 2023-06-07 DIAGNOSIS — M7989 Other specified soft tissue disorders: Secondary | ICD-10-CM

## 2023-06-07 DIAGNOSIS — R7989 Other specified abnormal findings of blood chemistry: Secondary | ICD-10-CM

## 2023-06-07 LAB — CYTOLOGY - PAP
Comment: NEGATIVE
Diagnosis: NEGATIVE
High risk HPV: NEGATIVE

## 2023-06-08 ENCOUNTER — Telehealth: Payer: Self-pay

## 2023-06-08 ENCOUNTER — Ambulatory Visit (INDEPENDENT_AMBULATORY_CARE_PROVIDER_SITE_OTHER)

## 2023-06-08 ENCOUNTER — Ambulatory Visit (HOSPITAL_COMMUNITY)
Admission: RE | Admit: 2023-06-08 | Discharge: 2023-06-08 | Disposition: A | Discharge status: Home / Self Care | Source: Ambulatory Visit | Attending: Internal Medicine | Admitting: Internal Medicine

## 2023-06-08 DIAGNOSIS — M7989 Other specified soft tissue disorders: Secondary | ICD-10-CM | POA: Diagnosis not present

## 2023-06-08 DIAGNOSIS — R0609 Other forms of dyspnea: Secondary | ICD-10-CM | POA: Diagnosis not present

## 2023-06-08 DIAGNOSIS — R7989 Other specified abnormal findings of blood chemistry: Secondary | ICD-10-CM | POA: Insufficient documentation

## 2023-06-08 NOTE — Telephone Encounter (Signed)
 Spoke to patient, she is going for the US  on her legs for the swelling today as well.  The chest x-ray images are available and no report yet.  Can you look at them or do you need for us  to call and see if we can get a stat read on the x-ray?   Please review and advise. Thanks. Dm/cma

## 2023-06-08 NOTE — Telephone Encounter (Signed)
 Copied from CRM [id number]. Topic: Clinical - Lab/Test Results >> Jun 08, 2023 12:05 PM Aisha D wrote: Reason for CRM: Patient stated that she just had her xray done today and would like to change the results from routine to stat if possible.

## 2023-06-09 ENCOUNTER — Other Ambulatory Visit: Payer: Self-pay | Admitting: Internal Medicine

## 2023-06-09 ENCOUNTER — Ambulatory Visit (HOSPITAL_COMMUNITY)
Admission: RE | Admit: 2023-06-09 | Discharge: 2023-06-09 | Disposition: A | Discharge status: Home / Self Care | Source: Ambulatory Visit | Attending: Internal Medicine | Admitting: Internal Medicine

## 2023-06-09 DIAGNOSIS — R0602 Shortness of breath: Secondary | ICD-10-CM | POA: Insufficient documentation

## 2023-06-09 DIAGNOSIS — R7989 Other specified abnormal findings of blood chemistry: Secondary | ICD-10-CM

## 2023-06-09 MED ORDER — IOHEXOL 350 MG/ML SOLN
75.0000 mL | Freq: Once | INTRAVENOUS | Status: AC | PRN
  Administered 2023-06-09: 75 mL via INTRAVENOUS

## 2023-06-09 NOTE — Telephone Encounter (Signed)
 Continue with routine x-ray.  I have ordered CT of her chest stat for her elevated D-dimer blood work level and shortness of breath.

## 2023-06-09 NOTE — Telephone Encounter (Signed)
Results have been relayed to the patient. The patient verbalized understanding. No questions at this time.   

## 2023-06-10 ENCOUNTER — Encounter: Payer: Self-pay | Admitting: Internal Medicine

## 2023-06-11 ENCOUNTER — Encounter: Payer: Self-pay | Admitting: Internal Medicine

## 2023-06-11 ENCOUNTER — Other Ambulatory Visit: Payer: Self-pay | Admitting: Internal Medicine

## 2023-06-11 DIAGNOSIS — K769 Liver disease, unspecified: Secondary | ICD-10-CM

## 2023-06-11 DIAGNOSIS — R0602 Shortness of breath: Secondary | ICD-10-CM

## 2023-08-06 ENCOUNTER — Inpatient Hospital Stay (HOSPITAL_BASED_OUTPATIENT_CLINIC_OR_DEPARTMENT_OTHER): Admitting: Family

## 2023-08-06 ENCOUNTER — Encounter: Payer: Self-pay | Admitting: Family

## 2023-08-06 ENCOUNTER — Inpatient Hospital Stay: Attending: Hematology & Oncology

## 2023-08-06 ENCOUNTER — Ambulatory Visit (HOSPITAL_COMMUNITY)
Admission: RE | Admit: 2023-08-06 | Discharge: 2023-08-06 | Disposition: A | Discharge status: Home / Self Care | Source: Ambulatory Visit | Attending: Internal Medicine | Admitting: Internal Medicine

## 2023-08-06 VITALS — BP 146/92 | HR 76 | Temp 98.2°F | Resp 18 | Wt 187.0 lb

## 2023-08-06 DIAGNOSIS — D500 Iron deficiency anemia secondary to blood loss (chronic): Secondary | ICD-10-CM

## 2023-08-06 DIAGNOSIS — D509 Iron deficiency anemia, unspecified: Secondary | ICD-10-CM

## 2023-08-06 DIAGNOSIS — R0602 Shortness of breath: Secondary | ICD-10-CM | POA: Diagnosis present

## 2023-08-06 LAB — CBC WITH DIFFERENTIAL (CANCER CENTER ONLY)
Abs Immature Granulocytes: 0.02 10*3/uL (ref 0.00–0.07)
Basophils Absolute: 0 10*3/uL (ref 0.0–0.1)
Basophils Relative: 0 %
Eosinophils Absolute: 0.1 10*3/uL (ref 0.0–0.5)
Eosinophils Relative: 1 %
HCT: 36.2 % (ref 36.0–46.0)
Hemoglobin: 12 g/dL (ref 12.0–15.0)
Immature Granulocytes: 0 %
Lymphocytes Relative: 26 %
Lymphs Abs: 1.9 10*3/uL (ref 0.7–4.0)
MCH: 29.1 pg (ref 26.0–34.0)
MCHC: 33.1 g/dL (ref 30.0–36.0)
MCV: 87.7 fL (ref 80.0–100.0)
Monocytes Absolute: 0.7 10*3/uL (ref 0.1–1.0)
Monocytes Relative: 9 %
Neutro Abs: 4.8 10*3/uL (ref 1.7–7.7)
Neutrophils Relative %: 64 %
Platelet Count: 263 10*3/uL (ref 150–400)
RBC: 4.13 MIL/uL (ref 3.87–5.11)
RDW: 12.3 % (ref 11.5–15.5)
WBC Count: 7.5 10*3/uL (ref 4.0–10.5)
nRBC: 0 % (ref 0.0–0.2)

## 2023-08-06 LAB — ECHOCARDIOGRAM COMPLETE
Area-P 1/2: 3.84 cm2
MV M vel: 5.3 m/s
MV Peak grad: 112.4 mmHg
Radius: 0.35 cm
S' Lateral: 2.4 cm

## 2023-08-06 LAB — RETICULOCYTES
Immature Retic Fract: 9.2 % (ref 2.3–15.9)
RBC.: 4.14 MIL/uL (ref 3.87–5.11)
Retic Count, Absolute: 77 10*3/uL (ref 19.0–186.0)
Retic Ct Pct: 1.9 % (ref 0.4–3.1)

## 2023-08-06 LAB — IRON AND IRON BINDING CAPACITY (CC-WL,HP ONLY)
Iron: 79 ug/dL (ref 28–170)
Saturation Ratios: 28 % (ref 10.4–31.8)
TIBC: 279 ug/dL (ref 250–450)
UIBC: 200 ug/dL (ref 148–442)

## 2023-08-06 LAB — FERRITIN: Ferritin: 147 ng/mL (ref 11–307)

## 2023-08-06 NOTE — Progress Notes (Signed)
 Hematology and Oncology Follow Up Visit  Candice Huffman [id number] Jun 25, 1980 43 y.o. 08/06/2023   Principle Diagnosis:  Iron  deficiency anemia    Current Therapy:        IV iron  as indicated    Interim History:  Candice Huffman is here today for follow-up. She is doing well and has no complaints at this time.  Her cycle is regular with normal flow. No other blood loss noted.  No bruising or petechiae.  She had a wonderful trip to [place] for her birthday earlier this week.  No fever, chills, n/v, cough, rash, dizziness, SOB, chest pain, palpitations, abdominal pain or changes in bowel or bladder habits.  No swelling, tenderness, numbness or tingling in her extremities.  No falls or syncope reported. Appetite and hydration are good. Weight is stable at 187 lbs.   ECOG Performance Status: 1 - Symptomatic but completely ambulatory  Medications:  Allergies as of 08/06/2023       Reactions   Lisinopril Cough, Swelling        Medication List        Accurate as of August 06, 2023  1:09 PM. If you have any questions, ask your nurse or doctor.          amLODipine  5 MG tablet Commonly known as: NORVASC  Take 1 tablet (5 mg total) by mouth daily.   ferrous sulfate  325 (65 FE) MG tablet Take 1 tablet (325 mg total) by mouth daily with breakfast.   fluconazole  150 MG tablet Commonly known as: Diflucan  Take 1 tablet by mouth once. Repeat dose in 3 days if symptoms persist.        Allergies:  Allergies  Allergen Reactions   Lisinopril Cough and Swelling    Past Medical History, Surgical history, Social history, and Family History were reviewed and updated.  Review of Systems: All other 10 point review of systems is negative.   Physical Exam:  vitals were not taken for this visit.   Wt Readings from Last 3 Encounters:  06/04/23 183 lb 3.2 oz (83.1 kg)  05/11/23 188 lb (85.3 kg)  03/12/23 185 lb (83.9 kg)    Ocular: Sclerae unicteric, pupils equal, round and  reactive to light Ear-nose-throat: Oropharynx clear, dentition fair Lymphatic: No cervical or supraclavicular adenopathy Lungs no rales or rhonchi, good excursion bilaterally Heart regular rate and rhythm, no murmur appreciated Abd soft, nontender, positive bowel sounds MSK no focal spinal tenderness, no joint edema Neuro: non-focal, well-oriented, appropriate affect Breasts: Deferred   Lab Results  Component Value Date   WBC 7.2 05/11/2023   HGB 12.5 05/11/2023   HCT 38.4 05/11/2023   MCV 87.7 05/11/2023   PLT 279 05/11/2023   Lab Results  Component Value Date   FERRITIN 150 05/11/2023   IRON  76 05/11/2023   TIBC 248 (L) 05/11/2023   UIBC 172 05/11/2023   IRONPCTSAT 31 05/11/2023   Lab Results  Component Value Date   RETICCTPCT 1.5 05/11/2023   RBC 4.38 05/11/2023   RBC 4.43 05/11/2023   No results found for: KPAFRELGTCHN, LAMBDASER, KAPLAMBRATIO No results found for: IGGSERUM, IGA, IGMSERUM No results found for: TOTALPROTELP, ALBUMINELP, A1GS, A2GS, BETS, BETA2SER, GAMS, MSPIKE, SPEI   Chemistry      Component Value Date/Time   NA 140 06/04/2023 0858   K 4.4 06/04/2023 0858   CL 104 06/04/2023 0858   CO2 30 06/04/2023 0858   BUN 11 06/04/2023 0858   CREATININE 0.85 06/04/2023 0858  Component Value Date/Time   CALCIUM 9.4 06/04/2023 0858   ALKPHOS 56 06/04/2023 0858   AST 9 06/04/2023 0858   ALT 8 06/04/2023 0858   BILITOT 0.8 06/04/2023 0858       Impression and Plan: Candice Huffman is a very pleasant 43 yo African American female with long history of IDA.  Iron  studies are pending. We will replace if needed.  Follow-up in 4 months.   Lauraine Pepper, NP 6/27/20251:09 PM

## 2023-08-10 ENCOUNTER — Ambulatory Visit: Payer: Self-pay | Admitting: Internal Medicine

## 2023-08-17 ENCOUNTER — Other Ambulatory Visit: Payer: Self-pay | Admitting: Internal Medicine

## 2023-08-17 DIAGNOSIS — I10 Essential (primary) hypertension: Secondary | ICD-10-CM

## 2023-11-30 ENCOUNTER — Other Ambulatory Visit: Payer: Self-pay | Admitting: Internal Medicine

## 2023-11-30 DIAGNOSIS — I10 Essential (primary) hypertension: Secondary | ICD-10-CM

## 2023-12-09 NOTE — Progress Notes (Deleted)
 [facility] [address] [place] [address] Dept: [phone] Dept Fax: [phone]    Subjective:   Candice Huffman 25-Sep-1980 12/10/2023  No chief complaint on file.   HPI: Candice Huffman presents today for re-assessment and management of chronic medical conditions.   BP Readings from Last 3 Encounters:  08/06/23 (!) 146/92  06/04/23 136/88  05/11/23 135/78     The following portions of the patient's history were reviewed and updated as appropriate: past medical history, past surgical history, family history, social history, allergies, medications, and problem list.   Patient Active Problem List   Diagnosis Date Noted   Iron  deficiency anemia 01/29/2023   Primary hypertension 01/29/2023   Encounter for sterilization 12/16/2010   NSVD (normal spontaneous vaginal delivery) 12/15/2010   VBAC, delivered, current hospitalization 12/15/2010   Meconium in amniotic fluid noted in labor/delivery, liveborn infant 12/15/2010   GBS (group B Streptococcus carrier), +RV culture, currently pregnant 12/14/2010   History of anxiety 12/14/2010   Gestational hypertension 12/08/2010   Previous cesarean section 12/08/2010   Past Medical History:  Diagnosis Date   Abnormal Pap smear    Pregnancy induced hypertension    Past Surgical History:  Procedure Laterality Date   CESAREAN SECTION     TONSILLECTOMY     TUBAL LIGATION  12/16/2010   Procedure: POST PARTUM TUBAL LIGATION;  Surgeon: Shanda SHAUNNA Muscat, MD;  Location: [facility];  Service: Gynecology;  Laterality: Bilateral;   No family history on file.  Current Outpatient Medications:    amLODipine  (NORVASC ) 5 MG tablet, Take 1 tablet by mouth once daily, Disp: 90 tablet, Rfl: 0   ferrous sulfate  325 (65 FE) MG tablet, Take 1 tablet (325 mg total) by mouth daily with breakfast. (Patient not taking: Reported on 08/06/2023), Disp: 90 tablet, Rfl: 1   fluconazole   (DIFLUCAN ) 150 MG tablet, Take 1 tablet by mouth once. Repeat dose in 3 days if symptoms persist. (Patient not taking: Reported on 08/06/2023), Disp: 2 tablet, Rfl: 0 Allergies  Allergen Reactions   Lisinopril Cough and Swelling     ROS: A complete ROS was performed with pertinent positives/negatives noted in the HPI. The remainder of the ROS are negative.    Objective:   There were no vitals filed for this visit.  GENERAL: Well-appearing, in NAD. Well nourished.  SKIN: Pink, warm and dry. No rash, lesion, ulceration, or ecchymoses.  NECK: Trachea midline. Full ROM w/o pain or tenderness. No lymphadenopathy.  RESPIRATORY: Chest wall symmetrical. Respirations even and non-labored. Breath sounds clear to auscultation bilaterally.  CARDIAC: S1, S2 present, regular rate and rhythm. Peripheral pulses 2+ bilaterally.  MSK: Muscle tone and strength appropriate for age. Joints w/o tenderness, redness, or swelling.  EXTREMITIES: Without clubbing, cyanosis, or edema.  NEUROLOGIC: No motor or sensory deficits. Steady, even gait.  PSYCH/MENTAL STATUS: Alert, oriented x 3. Cooperative, appropriate mood and affect.   Health Maintenance Due  Topic Date Due   Hepatitis B Vaccines 19-59 Average Risk (1 of 3 - 19+ 3-dose series) Never done   HPV VACCINES (1 - 3-dose SCDM series) Never done   Mammogram  Never done   Influenza Vaccine  09/10/2023   COVID-19 Vaccine (1 - 2025-26 season) Never done    No results found for any visits on 12/10/23.  The 10-year ASCVD risk score (Arnett DK, et al., 2019) is: 2.3%     Assessment & Plan:    There are no diagnoses linked to  this encounter. No orders of the defined types were placed in this encounter.  No images are attached to the encounter or orders placed in the encounter. No orders of the defined types were placed in this encounter.   No follow-ups on file.   Candice Senters, FNP

## 2023-12-10 ENCOUNTER — Inpatient Hospital Stay: Admitting: Family

## 2023-12-10 ENCOUNTER — Inpatient Hospital Stay: Attending: Internal Medicine

## 2023-12-10 ENCOUNTER — Ambulatory Visit: Admitting: Internal Medicine

## 2023-12-10 DIAGNOSIS — I10 Essential (primary) hypertension: Secondary | ICD-10-CM

## 2023-12-13 ENCOUNTER — Encounter: Payer: Self-pay | Admitting: Radiology

## 2024-03-07 ENCOUNTER — Encounter: Admitting: Family Medicine

## 2024-03-11 ENCOUNTER — Other Ambulatory Visit: Payer: Self-pay | Admitting: Internal Medicine

## 2024-03-11 DIAGNOSIS — I10 Essential (primary) hypertension: Secondary | ICD-10-CM

## 2024-03-13 NOTE — Telephone Encounter (Signed)
 Refill request received for Amlodipine  5mg  FOV: Nothing scheduled LOV: 06/04/23 Last refill: 11/30/23 Medication is pending your approval.
